# Patient Record
Sex: Male | Born: 1960
Health system: Southern US, Community
[De-identification: ages and names within clinical notes are randomized; demographics above are authoritative.]

## PROBLEM LIST (undated history)

## (undated) DIAGNOSIS — I10 Essential (primary) hypertension: Secondary | ICD-10-CM

## (undated) DIAGNOSIS — Z800 Family history of malignant neoplasm of digestive organs: Secondary | ICD-10-CM

## (undated) DIAGNOSIS — R112 Nausea with vomiting, unspecified: Secondary | ICD-10-CM

## (undated) DIAGNOSIS — Z803 Family history of malignant neoplasm of breast: Secondary | ICD-10-CM

## (undated) DIAGNOSIS — K219 Gastro-esophageal reflux disease without esophagitis: Secondary | ICD-10-CM

## (undated) DIAGNOSIS — C801 Malignant (primary) neoplasm, unspecified: Secondary | ICD-10-CM

## (undated) DIAGNOSIS — Z9889 Other specified postprocedural states: Secondary | ICD-10-CM

## (undated) DIAGNOSIS — T8859XA Other complications of anesthesia, initial encounter: Secondary | ICD-10-CM

## (undated) DIAGNOSIS — Z808 Family history of malignant neoplasm of other organs or systems: Secondary | ICD-10-CM

## (undated) HISTORY — DX: Essential (primary) hypertension: I10

## (undated) HISTORY — PX: ESOPHAGOGASTRODUODENOSCOPY (EGD) WITH PROPOFOL: SHX5813

## (undated) HISTORY — DX: Family history of malignant neoplasm of other organs or systems: Z80.8

## (undated) HISTORY — DX: Family history of malignant neoplasm of digestive organs: Z80.0

## (undated) HISTORY — DX: Family history of malignant neoplasm of breast: Z80.3

## (undated) HISTORY — PX: COLONOSCOPY WITH PROPOFOL: SHX5780

## (undated) HISTORY — DX: Gastro-esophageal reflux disease without esophagitis: K21.9

## (undated) HISTORY — DX: Malignant (primary) neoplasm, unspecified: C80.1

---

## 2000-11-11 ENCOUNTER — Emergency Department: Admit: 2000-11-11 | Payer: Self-pay | Source: Emergency Department

## 2001-09-15 ENCOUNTER — Emergency Department: Admit: 2001-09-15 | Payer: Self-pay | Source: Emergency Department | Admitting: Emergency Medicine

## 2001-09-22 ENCOUNTER — Emergency Department: Admit: 2001-09-22 | Payer: Self-pay | Source: Emergency Department

## 2002-02-11 ENCOUNTER — Emergency Department: Admit: 2002-02-11 | Payer: Self-pay | Source: Emergency Department | Admitting: Emergency Medicine

## 2004-07-11 DIAGNOSIS — C801 Malignant (primary) neoplasm, unspecified: Secondary | ICD-10-CM

## 2004-07-11 HISTORY — DX: Malignant (primary) neoplasm, unspecified: C80.1

## 2004-07-11 HISTORY — PX: TESTICLE REMOVAL: SHX68

## 2004-07-13 ENCOUNTER — Ambulatory Visit: Payer: Self-pay | Admitting: Internal Medicine

## 2004-07-16 ENCOUNTER — Ambulatory Visit: Payer: Self-pay | Admitting: Urology

## 2004-08-11 ENCOUNTER — Ambulatory Visit: Payer: Self-pay | Admitting: Internal Medicine

## 2004-09-10 ENCOUNTER — Ambulatory Visit: Payer: Self-pay | Admitting: Internal Medicine

## 2004-10-11 ENCOUNTER — Ambulatory Visit: Payer: Self-pay | Admitting: Internal Medicine

## 2004-11-23 ENCOUNTER — Ambulatory Visit: Payer: Self-pay | Admitting: Internal Medicine

## 2004-12-09 ENCOUNTER — Ambulatory Visit: Payer: Self-pay | Admitting: Internal Medicine

## 2005-03-22 ENCOUNTER — Ambulatory Visit: Payer: Self-pay | Admitting: Internal Medicine

## 2005-04-10 ENCOUNTER — Ambulatory Visit: Payer: Self-pay | Admitting: Internal Medicine

## 2005-05-26 ENCOUNTER — Ambulatory Visit: Payer: Self-pay | Admitting: Internal Medicine

## 2005-07-19 ENCOUNTER — Ambulatory Visit: Payer: Self-pay | Admitting: Internal Medicine

## 2005-08-11 ENCOUNTER — Ambulatory Visit: Payer: Self-pay | Admitting: Internal Medicine

## 2005-11-24 ENCOUNTER — Ambulatory Visit: Payer: Self-pay | Admitting: Internal Medicine

## 2005-12-01 ENCOUNTER — Ambulatory Visit: Payer: Self-pay | Admitting: Internal Medicine

## 2005-12-09 ENCOUNTER — Ambulatory Visit: Payer: Self-pay | Admitting: Internal Medicine

## 2006-05-30 ENCOUNTER — Ambulatory Visit: Payer: Self-pay | Admitting: Internal Medicine

## 2006-06-11 ENCOUNTER — Ambulatory Visit: Payer: Self-pay | Admitting: Internal Medicine

## 2006-09-21 ENCOUNTER — Ambulatory Visit: Payer: Self-pay | Admitting: Internal Medicine

## 2006-10-11 ENCOUNTER — Ambulatory Visit: Payer: Self-pay | Admitting: Internal Medicine

## 2007-03-12 ENCOUNTER — Ambulatory Visit: Payer: Self-pay | Admitting: Internal Medicine

## 2007-03-20 ENCOUNTER — Ambulatory Visit: Payer: Self-pay | Admitting: Internal Medicine

## 2007-04-11 ENCOUNTER — Ambulatory Visit: Payer: Self-pay | Admitting: Internal Medicine

## 2007-09-25 ENCOUNTER — Ambulatory Visit: Payer: Self-pay | Admitting: Internal Medicine

## 2007-09-27 ENCOUNTER — Ambulatory Visit: Payer: Self-pay | Admitting: Internal Medicine

## 2007-10-12 ENCOUNTER — Ambulatory Visit: Payer: Self-pay | Admitting: Internal Medicine

## 2008-03-27 ENCOUNTER — Ambulatory Visit: Payer: Self-pay | Admitting: Internal Medicine

## 2008-04-10 ENCOUNTER — Ambulatory Visit: Payer: Self-pay | Admitting: Internal Medicine

## 2008-04-27 ENCOUNTER — Emergency Department: Admit: 2008-04-27 | Payer: Self-pay | Source: Emergency Department | Admitting: Emergency Medicine

## 2008-04-27 LAB — URINALYSIS WITH MICROSCOPIC
Bilirubin, UA: NEGATIVE
Glucose, UA: NEGATIVE
Leukocyte Esterase, UA: NEGATIVE
Nitrite, UA: NEGATIVE
Protein, UR: 30
Specific Gravity UA POCT: 1.025 (ref <=1.030)
Urine pH: 5.5 (ref 5.0–8.0)
Urobilinogen, UA: 0.1

## 2008-09-10 ENCOUNTER — Ambulatory Visit: Payer: Self-pay | Admitting: Internal Medicine

## 2008-09-25 ENCOUNTER — Ambulatory Visit: Payer: Self-pay | Admitting: Internal Medicine

## 2008-10-11 ENCOUNTER — Ambulatory Visit: Payer: Self-pay | Admitting: Internal Medicine

## 2009-09-10 ENCOUNTER — Ambulatory Visit: Payer: Self-pay | Admitting: Internal Medicine

## 2009-09-22 ENCOUNTER — Ambulatory Visit: Payer: Self-pay | Admitting: Internal Medicine

## 2009-10-11 ENCOUNTER — Ambulatory Visit: Payer: Self-pay | Admitting: Internal Medicine

## 2011-10-29 LAB — HM COLONOSCOPY

## 2012-07-04 ENCOUNTER — Ambulatory Visit: Payer: Self-pay | Admitting: Urology

## 2014-03-12 ENCOUNTER — Emergency Department: Payer: Self-pay | Admitting: Emergency Medicine

## 2014-03-12 LAB — CBC WITH DIFFERENTIAL/PLATELET
Basophil #: 0 10*3/uL (ref 0.0–0.1)
Basophil %: 0.2 %
Eosinophil #: 0 10*3/uL (ref 0.0–0.7)
Eosinophil %: 0.4 %
HCT: 46.5 % (ref 40.0–52.0)
HGB: 15.4 g/dL (ref 13.0–18.0)
Lymphocyte #: 1.7 10*3/uL (ref 1.0–3.6)
Lymphocyte %: 19 %
MCH: 30.6 pg (ref 26.0–34.0)
MCHC: 33.1 g/dL (ref 32.0–36.0)
MCV: 92 fL (ref 80–100)
Monocyte #: 0.6 x10 3/mm (ref 0.2–1.0)
Monocyte %: 7.2 %
Neutrophil #: 6.4 10*3/uL (ref 1.4–6.5)
Neutrophil %: 73.2 %
Platelet: 248 10*3/uL (ref 150–440)
RBC: 5.04 10*6/uL (ref 4.40–5.90)
RDW: 12.9 % (ref 11.5–14.5)
WBC: 8.8 10*3/uL (ref 3.8–10.6)

## 2014-03-12 LAB — URINALYSIS, COMPLETE
Bacteria: NONE SEEN
Bilirubin,UR: NEGATIVE
Blood: NEGATIVE
Glucose,UR: NEGATIVE mg/dL (ref 0–75)
Leukocyte Esterase: NEGATIVE
Nitrite: NEGATIVE
Ph: 5 (ref 4.5–8.0)
Protein: NEGATIVE
RBC,UR: NONE SEEN /HPF (ref 0–5)
Specific Gravity: 1.027 (ref 1.003–1.030)
Squamous Epithelial: NONE SEEN
WBC UR: <1 /HPF (ref 0–5)

## 2014-03-12 LAB — COMPREHENSIVE METABOLIC PANEL
Albumin: 4.4 g/dL (ref 3.4–5.0)
Alkaline Phosphatase: 68 U/L
Anion Gap: 7 (ref 7–16)
BUN: 14 mg/dL (ref 7–18)
Bilirubin,Total: 0.7 mg/dL (ref 0.2–1.0)
Calcium, Total: 9.5 mg/dL (ref 8.5–10.1)
Chloride: 106 mmol/L (ref 98–107)
Co2: 27 mmol/L (ref 21–32)
Creatinine: 1.09 mg/dL (ref 0.60–1.30)
EGFR (African American): >60
EGFR (Non-African Amer.): >60
Glucose: 99 mg/dL (ref 65–99)
Osmolality: 280 (ref 275–301)
Potassium: 3.8 mmol/L (ref 3.5–5.1)
SGOT(AST): 21 U/L (ref 15–37)
SGPT (ALT): 34 U/L (ref 12–78)
Sodium: 140 mmol/L (ref 136–145)
Total Protein: 8.1 g/dL (ref 6.4–8.2)

## 2014-03-12 LAB — TROPONIN I: Troponin-I: <0.02 ng/mL

## 2014-03-12 LAB — LIPASE, BLOOD: Lipase: 81 U/L (ref 73–393)

## 2015-04-09 ENCOUNTER — Other Ambulatory Visit: Payer: Self-pay | Admitting: Unknown Physician Specialty

## 2015-04-09 DIAGNOSIS — M25561 Pain in right knee: Secondary | ICD-10-CM

## 2015-04-11 ENCOUNTER — Ambulatory Visit
Admission: RE | Admit: 2015-04-11 | Discharge: 2015-04-11 | Disposition: A | Discharge status: Home / Self Care | Payer: BLUE CROSS/BLUE SHIELD | Source: Ambulatory Visit | Attending: Unknown Physician Specialty | Admitting: Unknown Physician Specialty

## 2015-04-11 DIAGNOSIS — M25461 Effusion, right knee: Secondary | ICD-10-CM | POA: Insufficient documentation

## 2015-04-11 DIAGNOSIS — M25561 Pain in right knee: Secondary | ICD-10-CM

## 2015-04-18 ENCOUNTER — Ambulatory Visit: Payer: Self-pay

## 2015-05-23 ENCOUNTER — Ambulatory Visit (INDEPENDENT_AMBULATORY_CARE_PROVIDER_SITE_OTHER): Payer: BLUE CROSS/BLUE SHIELD | Admitting: Unknown Physician Specialty

## 2015-05-23 ENCOUNTER — Encounter: Payer: Self-pay | Admitting: Unknown Physician Specialty

## 2015-05-23 VITALS — BP 156/90 | HR 73 | Temp 98.6°F | Ht 74.1 in | Wt 283.6 lb

## 2015-05-23 DIAGNOSIS — J069 Acute upper respiratory infection, unspecified: Secondary | ICD-10-CM | POA: Diagnosis not present

## 2015-05-23 DIAGNOSIS — I10 Essential (primary) hypertension: Secondary | ICD-10-CM | POA: Diagnosis not present

## 2015-05-23 DIAGNOSIS — K219 Gastro-esophageal reflux disease without esophagitis: Secondary | ICD-10-CM

## 2015-05-23 DIAGNOSIS — C6290 Malignant neoplasm of unspecified testis, unspecified whether descended or undescended: Secondary | ICD-10-CM | POA: Insufficient documentation

## 2015-05-23 NOTE — Patient Instructions (Signed)
Think you're too busy to work out? We have the workout for you. In minutes, high-intensity interval training (H.I.I.T.) will have you sweating, breathing hard and maximizing the health benefits of exercise without the time commitment. Best of all, it's scientifically proven to work.  What Is H.I.I.T.? SHORT WORKOUTS 101 High-intensity interval training - referred to as H.I.I.T. - is based on the idea that short bursts of strenuous exercise can have a big impact on the body. If moderate exercise - like a 20-minute jog - is good for your heart, lungs and metabolism, H.I.I.T. packs the benefits of that workout and more into a few minutes. It may sound too good to be true, but learning this exercise technique and adapting it to your life can mean saving hours at the gym. If you think you don't have time to exercise, H.I.I.T. may be the workout for you.  You can try it with any aerobic activity you like. The principles of H.I.I.T. can be applied to running, biking, stair climbing, swimming, jumping rope, rowing, even hopping or skipping. (Yes, skipping!)  The downside? Even though H.I.I.T. lasts only minutes, the workouts are tough, requiring you to push your body near its limit.  HOW INTENSE IS HIGH INTENSITY? High-intensity exercise is obviously not a casual stroll down the street, but it's not a run-till-your-lungs-pop explosion, either. Think breathless, not winded. Heart-pounding, not exploding. Legs pumping, but not uncontrolled.  You don't need any fancy heart rate monitors to do these workouts. Use cues from your body as a guide. In the middle of a high-intensity workout you should be able to say single words, but not complete whole sentences. So, if you can keep chatting to your workout partner during this workout, pump it up a few notches.  07-30-29 Training This simple program will help you make the most of a short workout by improving heart health and endurance. Try it with your favorite  cardiovascular activity. The essentials of 07-30-29 training are simple. Run, ride or perhaps row on a rowing machine gently for 30 seconds, accelerate to a moderate pace for 20 seconds, then sprint as hard as you can for 10 seconds. (It should be called 30-20-10 training, obviously, but that is not as catchy.) Repeat.  You don't even need a stopwatch to monitor the 30-, 20-, and 10-second time changes. You can just count to yourself, which seems to make the intervals pass more quickly.  Best of all? The grueling, all-out portion of the workout lasts for only 10 seconds. C'mon, you can do anything for 10 seconds, right?  Got 10 Minutes? A solitary minute of hard work buried in 10 minutes of activity can make a big difference.  The 10-Minute Workout If you like to run, bike, row or swim - just a little bit - this workout is a great option for you. Step 1 Warm up for 2 minutes Step 2 Pedal, run or swim all-out for 20 seconds. Repeat 2 more times Warm down for 3 minutes    GET STARTED To benefit the most from really, really short workouts, you need to build the habit of doing them into your hectic life. Ideally, you'll complete the workout three times a week. The best way to build that habit is to start small and be willing to tweak your schedule where you can to accommodate your new workout.  First set up a spot in your house for your workout, equipped with whatever you need to get the job done: sneakers, a  chair, a towel, etc. Then slot your workout in before you would normally shower. (You can even do it in the bathroom.) Or wake up five minutes earlier and do it first thing in the morning, so you can head off to work feeling accomplished. Or do it during your lunch hour. Run up your office's stairs or grab a private conference room for just a few minutes. Or work it into your commute. If you walk or bike to work, add some heavy intervals on the way home.  GET A BOOST FROM MUSIC Creating a  workout playlist of high-energy tunes you love will not make your workout feel easier, but it may cause you to exercise harder without even realizing it. Best of all, if you are doing a really short workout, you need only one or two great tunes to get you through. If you are willing to try something a bit different, make your own music as you exercise. Sing, hum, clap your hands, whatever you can do to jam along to your playlist. It may give you an extra boost to finish strong.  Find a song or podcast that's the length of your really, really short workout. By the time the song is over, you're done.  Excerpted from the [employer] Well column http://www.nytimes.com/well/guides/really-really-short-workouts?smid=fb-nytwell&smtyp=pay  

## 2015-05-23 NOTE — Assessment & Plan Note (Signed)
In the setting of cold medication and Meloxicam.  Will stop those and check with home monitor.

## 2015-05-23 NOTE — Assessment & Plan Note (Signed)
Stop Meloxicam and wean off Pantoprazole slowly

## 2015-05-23 NOTE — Progress Notes (Signed)
BP 156/90 mmHg  Pulse 73  Temp(Src) 98.6 F (37 C)  Ht 6' 2.1" (1.882 m)  Wt 283 lb 9.6 oz (128.64 kg)  BMI 36.32 kg/m2  SpO2 95%   Subjective:    Patient ID: Micheal Khan, male    DOB: September 05, 1961, 54 y.o.   MRN: [id number]  HPI: Micheal Khan is a 54 y.o. male  Chief Complaint  Patient presents with  . Hypertension    pt states his blood pressure was up and is concerned  . Sinusitis    pt states he has had runny nose, sinus headache, sinus pressure and sinus drainage  . GI Problem    pt has also had some stomach issues, and loose stools   Hypertension This is a new problem. The current episode started yesterday (Noted after taking OTC cold medication). Associated symptoms include headaches. Pertinent negatives include no blurred vision, chest pain, palpitations, shortness of breath or sweats. Associated agents: OTC cold medication.  GI Problem The primary symptoms include abdominal pain and diarrhea. Primary symptoms comment: had been on Pantoprazole.  But taking Mobic through Dr. Jefm Bryant.  . The illness began 3 to 5 days ago.  Significant associated medical issues include GERD.  URI  This is a new problem. The current episode started in the past 7 days. The problem has been gradually improving. There has been no fever. Associated symptoms include abdominal pain, congestion, coughing, diarrhea, headaches, sinus pain and a sore throat. Pertinent negatives include no chest pain. He has tried decongestant for the symptoms. The treatment provided mild relief.     Relevant past medical, surgical, family and social history reviewed and updated as indicated. Interim medical history since our last visit reviewed. Allergies and medications reviewed and updated.  Review of Systems  HENT: Positive for congestion and sore throat.   Eyes: Negative for blurred vision.  Respiratory: Positive for cough. Negative for shortness of breath.   Cardiovascular: Negative for chest pain and  palpitations.  Gastrointestinal: Positive for abdominal pain and diarrhea.  Neurological: Positive for headaches.    Per HPI unless specifically indicated above     Objective:    BP 156/90 mmHg  Pulse 73  Temp(Src) 98.6 F (37 C)  Ht 6' 2.1" (1.882 m)  Wt 283 lb 9.6 oz (128.64 kg)  BMI 36.32 kg/m2  SpO2 95%  Wt Readings from Last 3 Encounters:  05/23/15 283 lb 9.6 oz (128.64 kg)  03/29/14 280 lb (127.007 kg)  04/11/15 292 lb (132.45 kg)    Physical Exam  Constitutional: He is oriented to person, place, and time. He appears well-developed and well-nourished. No distress.  HENT:  Head: Normocephalic and atraumatic.  Eyes: Conjunctivae and lids are normal. Right eye exhibits no discharge. Left eye exhibits no discharge. No scleral icterus.  Cardiovascular: Normal rate, regular rhythm and normal heart sounds.   Pulmonary/Chest: Effort normal and breath sounds normal. No respiratory distress.  Abdominal: Normal appearance and bowel sounds are normal. He exhibits no distension. There is no splenomegaly or hepatomegaly. There is no tenderness.  Musculoskeletal: Normal range of motion.  Neurological: He is alert and oriented to person, place, and time.  Skin: Skin is warm, dry and intact. No rash noted. No pallor.  Psychiatric: He has a normal mood and affect. His behavior is normal. Judgment and thought content normal.     Assessment & Plan:   Problem List Items Addressed This Visit      Unprioritized   GERD (gastroesophageal  reflux disease)    Stop Meloxicam and wean off Pantoprazole slowly      Essential hypertension, benign - Primary    In the setting of cold medication and Meloxicam.  Will stop those and check with home monitor.         Other Visit Diagnoses    URI (upper respiratory infection)        Supportive care        Follow up plan: Return in about 4 weeks (around 06/20/2015), or if symptoms worsen or fail to improve, for BP.

## 2015-06-20 ENCOUNTER — Ambulatory Visit (INDEPENDENT_AMBULATORY_CARE_PROVIDER_SITE_OTHER): Payer: BLUE CROSS/BLUE SHIELD | Admitting: Unknown Physician Specialty

## 2015-06-20 ENCOUNTER — Encounter: Payer: Self-pay | Admitting: Unknown Physician Specialty

## 2015-06-20 VITALS — BP 125/77 | HR 68 | Temp 99.3°F | Ht 74.2 in | Wt 276.4 lb

## 2015-06-20 DIAGNOSIS — K219 Gastro-esophageal reflux disease without esophagitis: Secondary | ICD-10-CM | POA: Diagnosis not present

## 2015-06-20 DIAGNOSIS — I10 Essential (primary) hypertension: Secondary | ICD-10-CM | POA: Diagnosis not present

## 2015-06-20 MED ORDER — MOMETASONE FUROATE 50 MCG/ACT NA SUSP: 2.0000 | Freq: Every day | NASAL | Status: DC

## 2015-06-20 NOTE — Assessment & Plan Note (Signed)
Well controlled blood pressures at home for the last month. Will continue to monitor.

## 2015-06-20 NOTE — Assessment & Plan Note (Addendum)
Discuss taper off of Pantoprazole.  Will begin to ween off and slowly replace with zantac OTC

## 2015-06-20 NOTE — Progress Notes (Signed)
BP 125/77 mmHg  Pulse 68  Temp(Src) 99.3 F (37.4 C)  Ht 6' 2.2" (1.885 m)  Wt 276 lb 6.4 oz (125.374 kg)  BMI 35.28 kg/m2  SpO2 97%   Subjective:    Patient ID: Micheal Khan, male    DOB: 04-25-1961, 54 y.o.   MRN: [id number]  HPI: Micheal Khan is a 54 y.o. male  Chief Complaint  Patient presents with  . Hypertension    pt states he has been check BP at home, has list with recordings for provider to see.   Hypertension: His home blood pressure readings have been stable - 235'T- 732 systolic and 20'U diastolic. He denies and chest pain, headaches or shortness of breath. He has begun walking each day for exercise. Excellent compliance.  GERD: Currently taking pantoprazole but wishes to wean off due to history of stress fractures . He denies any reflux or stomach problems. Has bought zantac OTC to slowly replace the pantoprazole.  Relevant past medical, surgical, family and social history reviewed and updated as indicated. Interim medical history since our last visit reviewed. Allergies and medications reviewed and updated.  Review of Systems  Constitutional: Negative.  Negative for fever, activity change, appetite change and fatigue.  HENT: Negative.  Negative for congestion, postnasal drip, rhinorrhea, sinus pressure, sneezing and sore throat.   Respiratory: Negative.  Negative for cough, choking, chest tightness, shortness of breath, wheezing and stridor.   Cardiovascular: Negative.  Negative for chest pain, palpitations and leg swelling.  Musculoskeletal: Negative.  Negative for back pain, joint swelling, arthralgias and gait problem.  Skin: Negative.  Negative for color change, pallor, rash and wound.  Neurological: Negative.   Psychiatric/Behavioral: Negative.  Negative for confusion, sleep disturbance, self-injury and agitation. The patient is not nervous/anxious and is not hyperactive.     Per HPI unless specifically indicated above     Objective:    BP 125/77  mmHg  Pulse 68  Temp(Src) 99.3 F (37.4 C)  Ht 6' 2.2" (1.885 m)  Wt 276 lb 6.4 oz (125.374 kg)  BMI 35.28 kg/m2  SpO2 97%  Wt Readings from Last 3 Encounters:  06/20/15 276 lb 6.4 oz (125.374 kg)  05/23/15 283 lb 9.6 oz (128.64 kg)  03/29/14 280 lb (127.007 kg)    Physical Exam  Constitutional: He is oriented to person, place, and time. He appears well-developed and well-nourished. No distress.  HENT:  Head: Normocephalic and atraumatic.  Mouth/Throat: No oropharyngeal exudate.  Neck: Normal range of motion.  Cardiovascular: Normal rate, regular rhythm and normal heart sounds.  Exam reveals no gallop and no friction rub.   No murmur heard. Pulmonary/Chest: Effort normal.  Neurological: He is alert and oriented to person, place, and time.  Skin: Skin is warm and dry. No rash noted. He is not diaphoretic. No erythema. No pallor.  Psychiatric: He has a normal mood and affect. His behavior is normal. Judgment and thought content normal.        Assessment & Plan:   Problem List Items Addressed This Visit      Unprioritized   GERD (gastroesophageal reflux disease) - Primary    Discuss taper off of Pantoprazole.  Will begin to ween off and slowly replace with zantac OTC      Relevant Medications   pantoprazole (PROTONIX) 40 MG tablet   Essential hypertension, benign    Well controlled blood pressures at home for the last month. Will continue to monitor.  Follow up plan: Return in about 6 months (around 12/18/2015) for physical.

## 2015-12-26 ENCOUNTER — Encounter: Payer: BLUE CROSS/BLUE SHIELD | Admitting: Unknown Physician Specialty

## 2016-01-16 ENCOUNTER — Encounter: Payer: BLUE CROSS/BLUE SHIELD | Admitting: Unknown Physician Specialty

## 2016-02-25 ENCOUNTER — Encounter: Payer: Self-pay | Admitting: Unknown Physician Specialty

## 2016-02-25 ENCOUNTER — Ambulatory Visit (INDEPENDENT_AMBULATORY_CARE_PROVIDER_SITE_OTHER): Payer: BLUE CROSS/BLUE SHIELD | Admitting: Unknown Physician Specialty

## 2016-02-25 VITALS — BP 145/93 | HR 64 | Temp 98.3°F | Ht 73.5 in | Wt 279.7 lb

## 2016-02-25 DIAGNOSIS — Z8547 Personal history of malignant neoplasm of testis: Secondary | ICD-10-CM

## 2016-02-25 DIAGNOSIS — Z0000 Encounter for general adult medical examination without abnormal findings: Secondary | ICD-10-CM

## 2016-02-25 DIAGNOSIS — K219 Gastro-esophageal reflux disease without esophagitis: Secondary | ICD-10-CM | POA: Diagnosis not present

## 2016-02-25 DIAGNOSIS — I10 Essential (primary) hypertension: Secondary | ICD-10-CM

## 2016-02-25 NOTE — Assessment & Plan Note (Addendum)
Borderline high today, better at home.  Continue home monitoring

## 2016-02-25 NOTE — Progress Notes (Signed)
BP 145/93 mmHg  Pulse 64  Temp(Src) 98.3 F (36.8 C)  Ht 6' 1.5" (1.867 m)  Wt 279 lb 11.2 oz (126.871 kg)  BMI 36.40 kg/m2  SpO2 96%   Subjective:    Patient ID: Micheal Khan, male    DOB: April 10, 1961, 55 y.o.   MRN: [id number]  HPI: Micheal Khan is a 55 y.o. male  Chief Complaint  Patient presents with  . Annual Exam    Hep C and HIV order entered   Hypertension Using medications without difficulty Average home BPs Hasn't checked much in the last month but 120's/70's when he does.     No problems or lightheadedness No chest pain with exertion or shortness of breath No Edema  Reflux Managing with Zantac every morning.    Family History  Problem Relation Age of Onset  . Cancer Mother     melanoma  . Heart disease Father     MI  . Cancer Maternal Grandfather   . Stroke Paternal Grandmother   . Heart disease Paternal Grandfather     Relevant past medical, surgical, family and social history reviewed and updated as indicated. Interim medical history since our last visit reviewed. Allergies and medications reviewed and updated.  Review of Systems  Constitutional: Negative.   HENT: Negative.   Eyes: Negative.   Respiratory: Negative.   Cardiovascular: Negative.   Gastrointestinal: Negative.   Endocrine: Negative.   Genitourinary: Negative.   Musculoskeletal:       Knees are doing better  Skin: Negative.   Allergic/Immunologic: Negative.   Neurological: Negative.   Hematological: Negative.   Psychiatric/Behavioral: Negative.     Per HPI unless specifically indicated above     Objective:    BP 145/93 mmHg  Pulse 64  Temp(Src) 98.3 F (36.8 C)  Ht 6' 1.5" (1.867 m)  Wt 279 lb 11.2 oz (126.871 kg)  BMI 36.40 kg/m2  SpO2 96%  Wt Readings from Last 3 Encounters:  02/25/16 279 lb 11.2 oz (126.871 kg)  06/20/15 276 lb 6.4 oz (125.374 kg)  05/23/15 283 lb 9.6 oz (128.64 kg)    Physical Exam  Constitutional: He is oriented to person, place, and  time. He appears well-developed and well-nourished.  HENT:  Head: Normocephalic.  Right Ear: Tympanic membrane, external ear and ear canal normal.  Left Ear: Tympanic membrane, external ear and ear canal normal.  Mouth/Throat: Uvula is midline, oropharynx is clear and moist and mucous membranes are normal.  Eyes: Pupils are equal, round, and reactive to light.  Cardiovascular: Normal rate, regular rhythm and normal heart sounds.  Exam reveals no gallop and no friction rub.   No murmur heard. Pulmonary/Chest: Effort normal and breath sounds normal. No respiratory distress.  Abdominal: Soft. Bowel sounds are normal. He exhibits no distension. There is no tenderness.  Genitourinary: Rectum normal, prostate normal and penis normal.  Musculoskeletal: Normal range of motion.  Neurological: He is alert and oriented to person, place, and time. He has normal reflexes.  Skin: Skin is warm and dry.  Psychiatric: He has a normal mood and affect. His behavior is normal. Judgment and thought content normal.  Vitals reviewed.   Results for orders placed or performed in visit on 05/23/15  HM COLONOSCOPY  Result Value Ref Range   HM Colonoscopy from [facility]       Assessment & Plan:   Problem List Items Addressed This Visit      Unprioritized   Essential hypertension, benign  Borderline high today, better at home.  Continue home monitoring       Relevant Orders   Comprehensive metabolic panel   GERD (gastroesophageal reflux disease)    Continue Zantac      Relevant Medications   ranitidine (ZANTAC) 150 MG tablet    Other Visit Diagnoses    Health care maintenance    -  Primary    Relevant Orders    Hepatitis C antibody    HIV antibody    CBC with Differential/Platelet    Comprehensive metabolic panel    Lipid Panel w/o Chol/HDL Ratio    TSH    H/O testicular cancer        Relevant Orders    AFP tumor marker    LDH Isoenzyme    B-HCG Quant        Follow up plan: Return in  about 1 year (around 02/24/2017).

## 2016-02-25 NOTE — Addendum Note (Signed)
Addended by: Kathrine Haddock on: 02/25/2016 02:55 PM   Modules accepted: Orders, SmartSet

## 2016-02-25 NOTE — Assessment & Plan Note (Signed)
Continue Zantac 

## 2016-02-26 NOTE — Progress Notes (Signed)
Quick Note:  Normal labs. Pt notified through mychart ______ 

## 2016-02-27 LAB — HCG, BETA SUBUNIT, QN (SERIAL): HCG, Beta Chain, Quant, S: <1 m[IU]/mL (ref 0–3)

## 2016-02-27 LAB — COMPREHENSIVE METABOLIC PANEL
ALT: 23 IU/L (ref 0–44)
AST: 21 IU/L (ref 0–40)
Albumin/Globulin Ratio: 2.4 — ABNORMAL HIGH (ref 1.2–2.2)
Albumin: 4.7 g/dL (ref 3.5–5.5)
Alkaline Phosphatase: 65 IU/L (ref 39–117)
BUN/Creatinine Ratio: 11 (ref 9–20)
BUN: 13 mg/dL (ref 6–24)
Bilirubin Total: 0.3 mg/dL (ref 0.0–1.2)
CO2: 23 mmol/L (ref 18–29)
Calcium: 9.7 mg/dL (ref 8.7–10.2)
Chloride: 101 mmol/L (ref 96–106)
Creatinine, Ser: 1.18 mg/dL (ref 0.76–1.27)
GFR calc Af Amer: 81 mL/min/{1.73_m2} (ref >59)
GFR calc non Af Amer: 70 mL/min/{1.73_m2} (ref >59)
Globulin, Total: 2 g/dL (ref 1.5–4.5)
Glucose: 94 mg/dL (ref 65–99)
Potassium: 4.7 mmol/L (ref 3.5–5.2)
Sodium: 148 mmol/L — ABNORMAL HIGH (ref 134–144)
Total Protein: 6.7 g/dL (ref 6.0–8.5)

## 2016-02-27 LAB — CBC WITH DIFFERENTIAL/PLATELET
Basophils Absolute: 0 10*3/uL (ref 0.0–0.2)
Basos: 0 %
EOS (ABSOLUTE): 0.1 10*3/uL (ref 0.0–0.4)
Eos: 2 %
Hematocrit: 43.6 % (ref 37.5–51.0)
Hemoglobin: 14.4 g/dL (ref 12.6–17.7)
Immature Grans (Abs): 0 10*3/uL (ref 0.0–0.1)
Immature Granulocytes: 0 %
Lymphocytes Absolute: 1.8 10*3/uL (ref 0.7–3.1)
Lymphs: 28 %
MCH: 30.6 pg (ref 26.6–33.0)
MCHC: 33 g/dL (ref 31.5–35.7)
MCV: 93 fL (ref 79–97)
Monocytes Absolute: 0.6 10*3/uL (ref 0.1–0.9)
Monocytes: 9 %
Neutrophils Absolute: 4 10*3/uL (ref 1.4–7.0)
Neutrophils: 61 %
Platelets: 233 10*3/uL (ref 150–379)
RBC: 4.71 x10E6/uL (ref 4.14–5.80)
RDW: 13.7 % (ref 12.3–15.4)
WBC: 6.6 10*3/uL (ref 3.4–10.8)

## 2016-02-27 LAB — LACTATE DEHYDROGENASE, ISOENZYMES
(LD) Fraction 1: 26 % (ref 17–32)
(LD) Fraction 2: 40 % (ref 25–40)
(LD) Fraction 3: 23 % (ref 17–27)
(LD) Fraction 4: 4 % — ABNORMAL LOW (ref 5–13)
(LD) Fraction 5: 7 % (ref 4–20)
LDH: 144 IU/L (ref 121–224)

## 2016-02-27 LAB — LIPID PANEL W/O CHOL/HDL RATIO
Cholesterol, Total: 199 mg/dL (ref 100–199)
HDL: 47 mg/dL (ref >39)
LDL Calculated: 115 mg/dL — ABNORMAL HIGH (ref 0–99)
Triglycerides: 186 mg/dL — ABNORMAL HIGH (ref 0–149)
VLDL Cholesterol Cal: 37 mg/dL (ref 5–40)

## 2016-02-27 LAB — AFP TUMOR MARKER: AFP-Tumor Marker: 3 ng/mL (ref 0.0–8.3)

## 2016-02-27 LAB — HIV ANTIBODY (ROUTINE TESTING W REFLEX): HIV Screen 4th Generation wRfx: NONREACTIVE

## 2016-02-27 LAB — TSH: TSH: 3.29 u[IU]/mL (ref 0.450–4.500)

## 2016-02-27 LAB — HEPATITIS C ANTIBODY: Hep C Virus Ab: <0.1 s/co ratio (ref 0.0–0.9)

## 2016-06-24 DIAGNOSIS — H5213 Myopia, bilateral: Secondary | ICD-10-CM | POA: Diagnosis not present

## 2016-07-07 DIAGNOSIS — H0013 Chalazion right eye, unspecified eyelid: Secondary | ICD-10-CM | POA: Diagnosis not present

## 2016-08-24 ENCOUNTER — Ambulatory Visit (INDEPENDENT_AMBULATORY_CARE_PROVIDER_SITE_OTHER): Payer: BLUE CROSS/BLUE SHIELD

## 2016-08-24 DIAGNOSIS — Z23 Encounter for immunization: Secondary | ICD-10-CM

## 2017-02-25 ENCOUNTER — Encounter: Payer: BLUE CROSS/BLUE SHIELD | Admitting: Unknown Physician Specialty

## 2017-03-14 ENCOUNTER — Encounter: Payer: Self-pay | Admitting: Unknown Physician Specialty

## 2017-03-14 ENCOUNTER — Ambulatory Visit (INDEPENDENT_AMBULATORY_CARE_PROVIDER_SITE_OTHER): Payer: BLUE CROSS/BLUE SHIELD | Admitting: Unknown Physician Specialty

## 2017-03-14 VITALS — BP 134/81 | HR 68 | Temp 98.4°F | Ht 73.1 in | Wt 277.2 lb

## 2017-03-14 DIAGNOSIS — Z0000 Encounter for general adult medical examination without abnormal findings: Secondary | ICD-10-CM | POA: Diagnosis not present

## 2017-03-14 DIAGNOSIS — L989 Disorder of the skin and subcutaneous tissue, unspecified: Secondary | ICD-10-CM | POA: Diagnosis not present

## 2017-03-14 DIAGNOSIS — Z8547 Personal history of malignant neoplasm of testis: Secondary | ICD-10-CM | POA: Diagnosis not present

## 2017-03-14 NOTE — Addendum Note (Signed)
Addended by: Kathrine Haddock on: 03/14/2017 02:41 PM   Modules accepted: Orders

## 2017-03-14 NOTE — Progress Notes (Addendum)
+++++++++--+++++++++++++++++++++++++++++++++   BP 134/81   Pulse 68   Temp 98.4 F (36.9 C)   Ht 6' 1.1" (1.857 m)   Wt 277 lb 3.2 oz (125.7 kg)   SpO2 95%   BMI 36.47 kg/m    Subjective:    Patient ID: Micheal Khan, male    DOB: 07/26/1961, 56 y.o.   MRN: [id number]  HPI: Micheal Khan is a 56 y.o. male  Chief Complaint  Patient presents with  . Annual Exam   Social History   Social History  . Marital status: Married    Spouse name: N/A  . Number of children: N/A  . Years of education: N/A   Occupational History  . Not on file.   Social History Main Topics  . Smoking status: Never Smoker  . Smokeless tobacco: Never Used  . Alcohol use No  . Drug use: No  . Sexual activity: Yes   Other Topics Concern  . Not on file   Social History Narrative  . No narrative on file   Family History  Problem Relation Age of Onset  . Cancer Mother        melanoma  . Heart disease Father        MI  . Cancer Maternal Grandfather   . Stroke Paternal Grandmother   . Heart disease Paternal Grandfather    Past Medical History:  Diagnosis Date  . Cancer ([place])    testicular  . GERD (gastroesophageal reflux disease)    History reviewed. No pertinent surgical history.  Pt states he walks about 3 miles/day.   Relevant past medical, surgical, family and social history reviewed and updated as indicated. Interim medical history since our last visit reviewed. Allergies and medications reviewed and updated.  Review of Systems  Constitutional: Negative.   HENT: Negative.   Eyes: Negative.   Respiratory: Negative.   Cardiovascular: Negative.   Gastrointestinal: Negative.   Endocrine: Negative.   Genitourinary: Negative.        Ranitidine works for reflux  Skin:       Wants a dermatology appt to manage multiple skin lesions  Allergic/Immunologic: Negative.   Neurological: Negative.   Hematological: Negative.   Psychiatric/Behavioral: Negative.    Per HPI unless  specifically indicated above     Objective:    BP 134/81   Pulse 68   Temp 98.4 F (36.9 C)   Ht 6' 1.1" (1.857 m)   Wt 277 lb 3.2 oz (125.7 kg)   SpO2 95%   BMI 36.47 kg/m   Wt Readings from Last 3 Encounters:  03/14/17 277 lb 3.2 oz (125.7 kg)  02/25/16 279 lb 11.2 oz (126.9 kg)  06/20/15 276 lb 6.4 oz (125.4 kg)    Physical Exam  Constitutional: He is oriented to person, place, and time. He appears well-developed and well-nourished.  HENT:  Head: Normocephalic.  Right Ear: Tympanic membrane, external ear and ear canal normal.  Left Ear: Tympanic membrane, external ear and ear canal normal.  Mouth/Throat: Uvula is midline, oropharynx is clear and moist and mucous membranes are normal.  Eyes: Pupils are equal, round, and reactive to light.  Cardiovascular: Normal rate, regular rhythm and normal heart sounds.  Exam reveals no gallop and no friction rub.   No murmur heard. Pulmonary/Chest: Effort normal and breath sounds normal. No respiratory distress.  Abdominal: Soft. Bowel sounds are normal. He exhibits no distension. There is no tenderness.  Genitourinary: Rectum normal and prostate normal.  Musculoskeletal: Normal range  of motion.  Neurological: He is alert and oriented to person, place, and time. He has normal reflexes.  Skin: Skin is warm and dry.  Psychiatric: He has a normal mood and affect. His behavior is normal. Judgment and thought content normal.       Assessment & Plan:   Problem List Items Addressed This Visit    None    Visit Diagnoses    Routine general medical examination at a health care facility    -  Primary   Relevant Orders   CBC with Differential/Platelet   Comprehensive metabolic panel   Lipid Panel w/o Chol/HDL Ratio   PSA   TSH   History of testicular cancer       Relevant Orders   AFP tumor marker   Lactate Dehydrogenase (LDH)   B-HCG Quant   Skin lesions       Relevant Orders   Ambulatory referral to Dermatology       Follow  up plan: Return in about 1 year (around 03/14/2018).

## 2017-03-14 NOTE — Patient Instructions (Addendum)

## 2017-03-15 ENCOUNTER — Encounter: Payer: Self-pay | Admitting: Unknown Physician Specialty

## 2017-03-15 LAB — LIPID PANEL W/O CHOL/HDL RATIO
Cholesterol, Total: 216 mg/dL — ABNORMAL HIGH (ref 100–199)
HDL: 43 mg/dL (ref >39)
LDL Calculated: 117 mg/dL — ABNORMAL HIGH (ref 0–99)
Triglycerides: 281 mg/dL — ABNORMAL HIGH (ref 0–149)
VLDL Cholesterol Cal: 56 mg/dL — ABNORMAL HIGH (ref 5–40)

## 2017-03-15 LAB — COMPREHENSIVE METABOLIC PANEL
ALT: 21 IU/L (ref 0–44)
AST: 29 IU/L (ref 0–40)
Albumin/Globulin Ratio: 2.2 (ref 1.2–2.2)
Albumin: 4.9 g/dL (ref 3.5–5.5)
Alkaline Phosphatase: 61 IU/L (ref 39–117)
BUN/Creatinine Ratio: 18 (ref 9–20)
BUN: 21 mg/dL (ref 6–24)
Bilirubin Total: 0.3 mg/dL (ref 0.0–1.2)
CO2: 22 mmol/L (ref 18–29)
Calcium: 9.4 mg/dL (ref 8.7–10.2)
Chloride: 104 mmol/L (ref 96–106)
Creatinine, Ser: 1.16 mg/dL (ref 0.76–1.27)
GFR calc Af Amer: 82 mL/min/{1.73_m2} (ref >59)
GFR calc non Af Amer: 71 mL/min/{1.73_m2} (ref >59)
Globulin, Total: 2.2 g/dL (ref 1.5–4.5)
Glucose: 88 mg/dL (ref 65–99)
Potassium: 4.4 mmol/L (ref 3.5–5.2)
Sodium: 141 mmol/L (ref 134–144)
Total Protein: 7.1 g/dL (ref 6.0–8.5)

## 2017-03-15 LAB — CBC WITH DIFFERENTIAL/PLATELET
Basophils Absolute: 0 10*3/uL (ref 0.0–0.2)
Basos: 0 %
EOS (ABSOLUTE): 0.2 10*3/uL (ref 0.0–0.4)
Eos: 2 %
Hematocrit: 43.1 % (ref 37.5–51.0)
Hemoglobin: 14.4 g/dL (ref 13.0–17.7)
Immature Grans (Abs): 0 10*3/uL (ref 0.0–0.1)
Immature Granulocytes: 0 %
Lymphocytes Absolute: 2.1 10*3/uL (ref 0.7–3.1)
Lymphs: 26 %
MCH: 30.4 pg (ref 26.6–33.0)
MCHC: 33.4 g/dL (ref 31.5–35.7)
MCV: 91 fL (ref 79–97)
Monocytes Absolute: 0.8 10*3/uL (ref 0.1–0.9)
Monocytes: 10 %
Neutrophils Absolute: 4.9 10*3/uL (ref 1.4–7.0)
Neutrophils: 62 %
Platelets: 237 10*3/uL (ref 150–379)
RBC: 4.73 x10E6/uL (ref 4.14–5.80)
RDW: 13.5 % (ref 12.3–15.4)
WBC: 8 10*3/uL (ref 3.4–10.8)

## 2017-03-15 LAB — LACTATE DEHYDROGENASE: LDH: 179 IU/L (ref 121–224)

## 2017-03-15 LAB — TSH: TSH: 3.35 u[IU]/mL (ref 0.450–4.500)

## 2017-03-15 LAB — AFP TUMOR MARKER: AFP-Tumor Marker: 2.8 ng/mL (ref 0.0–8.3)

## 2017-03-15 LAB — PSA: Prostate Specific Ag, Serum: 0.6 ng/mL (ref 0.0–4.0)

## 2017-03-15 NOTE — Progress Notes (Signed)
Normal labs.  Pt notified through mychart

## 2017-03-16 DIAGNOSIS — L738 Other specified follicular disorders: Secondary | ICD-10-CM | POA: Diagnosis not present

## 2017-03-16 DIAGNOSIS — L821 Other seborrheic keratosis: Secondary | ICD-10-CM | POA: Diagnosis not present

## 2017-03-16 DIAGNOSIS — D2330 Other benign neoplasm of skin of unspecified part of face: Secondary | ICD-10-CM | POA: Diagnosis not present

## 2017-03-16 DIAGNOSIS — L918 Other hypertrophic disorders of the skin: Secondary | ICD-10-CM | POA: Diagnosis not present

## 2017-05-13 ENCOUNTER — Ambulatory Visit (INDEPENDENT_AMBULATORY_CARE_PROVIDER_SITE_OTHER): Payer: BLUE CROSS/BLUE SHIELD | Admitting: Unknown Physician Specialty

## 2017-05-13 ENCOUNTER — Encounter: Payer: Self-pay | Admitting: Unknown Physician Specialty

## 2017-05-13 VITALS — BP 125/75 | HR 64 | Temp 98.8°F | Wt 277.4 lb

## 2017-05-13 DIAGNOSIS — R197 Diarrhea, unspecified: Secondary | ICD-10-CM | POA: Diagnosis not present

## 2017-05-13 DIAGNOSIS — R1012 Left upper quadrant pain: Secondary | ICD-10-CM

## 2017-05-13 DIAGNOSIS — R109 Unspecified abdominal pain: Secondary | ICD-10-CM | POA: Insufficient documentation

## 2017-05-13 MED ORDER — OMEPRAZOLE 20 MG PO CPDR: 20.0000 mg | DELAYED_RELEASE_CAPSULE | Freq: Every day | ORAL | 3 refills | Status: DC

## 2017-05-13 MED ORDER — METRONIDAZOLE 500 MG PO TABS: 500.0000 mg | ORAL_TABLET | Freq: Two times a day (BID) | ORAL | 0 refills | Status: DC

## 2017-05-13 MED ORDER — CIPROFLOXACIN HCL 500 MG PO TABS: 500.0000 mg | ORAL_TABLET | Freq: Two times a day (BID) | ORAL | 0 refills | Status: DC

## 2017-05-13 NOTE — Patient Instructions (Signed)
BRAT diet

## 2017-05-13 NOTE — Progress Notes (Signed)
BP 125/75   Pulse 64   Temp 98.8 F (37.1 C)   Wt 277 lb 6.4 oz (125.8 kg)   SpO2 98%   BMI 36.50 kg/m    Subjective:    Patient ID: Micheal Khan, male    DOB: [birth year], 56 y.o.   MRN: [id number]  HPI: Micheal Khan is a 56 y.o. male  Chief Complaint  Patient presents with  . GI Problem    pt states he has been having off and on stomach issues for the last 10 days, states somedays he had diarrhea but no fever (states he checked a few times)   Abdominal Pain  This is a new problem. Episode onset: about 10 days ago. The onset quality is sudden (attributes some of it to [place] ). Episode frequency: symptoms come and go. The pain is located in the LLQ. The pain is mild. The quality of the pain is colicky. The abdominal pain radiates to the LUQ. Associated symptoms include belching, diarrhea and frequency. Pertinent negatives include no fever, nausea, vomiting or weight loss. Relieved by: OTC antacids. He has tried proton pump inhibitors, H2 blockers and antacids for the symptoms. The treatment provided mild relief. His past medical history is significant for GERD. There is no history of abdominal surgery or irritable bowel syndrome.     Relevant past medical, surgical, family and social history reviewed and updated as indicated. Interim medical history since our last visit reviewed. Allergies and medications reviewed and updated.  Review of Systems  Constitutional: Negative for fever and weight loss.  Gastrointestinal: Positive for abdominal pain and diarrhea. Negative for nausea and vomiting.  Genitourinary: Positive for frequency.    Per HPI unless specifically indicated above     Objective:    BP 125/75   Pulse 64   Temp 98.8 F (37.1 C)   Wt 277 lb 6.4 oz (125.8 kg)   SpO2 98%   BMI 36.50 kg/m   Wt Readings from Last 3 Encounters:  05/13/17 277 lb 6.4 oz (125.8 kg)  03/14/17 277 lb 3.2 oz (125.7 kg)  02/25/16 279 lb 11.2 oz (126.9 kg)    Physical  Exam  Constitutional: He is oriented to person, place, and time. He appears well-developed and well-nourished. No distress.  HENT:  Head: Normocephalic and atraumatic.  Eyes: Conjunctivae and lids are normal. Right eye exhibits no discharge. Left eye exhibits no discharge. No scleral icterus.  Neck: Normal range of motion. Neck supple. No JVD present. Carotid bruit is not present.  Cardiovascular: Normal rate, regular rhythm and normal heart sounds.   Pulmonary/Chest: Effort normal and breath sounds normal. No respiratory distress.  Abdominal: Normal appearance. There is no splenomegaly or hepatomegaly.  Musculoskeletal: Normal range of motion.  Neurological: He is alert and oriented to person, place, and time.  Skin: Skin is warm, dry and intact. No rash noted. No pallor.  Psychiatric: He has a normal mood and affect. His behavior is normal. Judgment and thought content normal.    Results for orders placed or performed in visit on 03/14/17  CBC with Differential/Platelet  Result Value Ref Range   WBC 8.0 3.4 - 10.8 x10E3/uL   RBC 4.73 4.14 - 5.80 x10E6/uL   Hemoglobin 14.4 13.0 - 17.7 g/dL   Hematocrit 43.1 37.5 - 51.0 %   MCV 91 79 - 97 fL   MCH 30.4 26.6 - 33.0 pg   MCHC 33.4 31.5 - 35.7 g/dL   RDW 13.5 12.3 -  15.4 %   Platelets 237 150 - 379 x10E3/uL   Neutrophils 62 Not Estab. %   Lymphs 26 Not Estab. %   Monocytes 10 Not Estab. %   Eos 2 Not Estab. %   Basos 0 Not Estab. %   Neutrophils Absolute 4.9 1.4 - 7.0 x10E3/uL   Lymphocytes Absolute 2.1 0.7 - 3.1 x10E3/uL   Monocytes Absolute 0.8 0.1 - 0.9 x10E3/uL   EOS (ABSOLUTE) 0.2 0.0 - 0.4 x10E3/uL   Basophils Absolute 0.0 0.0 - 0.2 x10E3/uL   Immature Granulocytes 0 Not Estab. %   Immature Grans (Abs) 0.0 0.0 - 0.1 x10E3/uL  Comprehensive metabolic panel  Result Value Ref Range   Glucose 88 65 - 99 mg/dL   BUN 21 6 - 24 mg/dL   Creatinine, Ser 1.16 0.76 - 1.27 mg/dL   GFR calc non Af Amer 71 >59 mL/min/1.73   GFR calc  Af Amer 82 >59 mL/min/1.73   BUN/Creatinine Ratio 18 9 - 20   Sodium 141 134 - 144 mmol/L   Potassium 4.4 3.5 - 5.2 mmol/L   Chloride 104 96 - 106 mmol/L   CO2 22 18 - 29 mmol/L   Calcium 9.4 8.7 - 10.2 mg/dL   Total Protein 7.1 6.0 - 8.5 g/dL   Albumin 4.9 3.5 - 5.5 g/dL   Globulin, Total 2.2 1.5 - 4.5 g/dL   Albumin/Globulin Ratio 2.2 1.2 - 2.2   Bilirubin Total 0.3 0.0 - 1.2 mg/dL   Alkaline Phosphatase 61 39 - 117 IU/L   AST 29 0 - 40 IU/L   ALT 21 0 - 44 IU/L  Lipid Panel w/o Chol/HDL Ratio  Result Value Ref Range   Cholesterol, Total 216 (H) 100 - 199 mg/dL   Triglycerides 281 (H) 0 - 149 mg/dL   HDL 43 >39 mg/dL   VLDL Cholesterol Cal 56 (H) 5 - 40 mg/dL   LDL Calculated 117 (H) 0 - 99 mg/dL  PSA  Result Value Ref Range   Prostate Specific Ag, Serum 0.6 0.0 - 4.0 ng/mL  TSH  Result Value Ref Range   TSH 3.350 0.450 - 4.500 uIU/mL  AFP tumor marker  Result Value Ref Range   AFP-Tumor Marker 2.8 0.0 - 8.3 ng/mL  Lactate Dehydrogenase (LDH)  Result Value Ref Range   LDH 179 121 - 224 IU/L      Assessment & Plan:   Problem List Items Addressed This Visit      Unprioritized   Abdominal pain    Suspect food related.  Rx for Flagyl and Cipro if unable to improve with diet and rest       Other Visit Diagnoses    Diarrhea of presumed infectious origin    -  Primary   BRAT diet       Follow up plan: Return if symptoms worsen or fail to improve.

## 2017-05-13 NOTE — Assessment & Plan Note (Signed)
Suspect food related.  Rx for Flagyl and Cipro if unable to improve with diet and rest

## 2017-07-20 DIAGNOSIS — Z23 Encounter for immunization: Secondary | ICD-10-CM | POA: Diagnosis not present

## 2017-11-17 DIAGNOSIS — Z6833 Body mass index (BMI) 33.0-33.9, adult: Secondary | ICD-10-CM | POA: Diagnosis not present

## 2017-11-17 DIAGNOSIS — R030 Elevated blood-pressure reading, without diagnosis of hypertension: Secondary | ICD-10-CM | POA: Diagnosis not present

## 2017-11-25 ENCOUNTER — Encounter: Payer: Self-pay | Admitting: Family Medicine

## 2017-11-25 ENCOUNTER — Ambulatory Visit: Payer: BLUE CROSS/BLUE SHIELD | Admitting: Family Medicine

## 2017-11-25 VITALS — BP 130/81 | HR 67 | Temp 98.1°F | Wt 279.7 lb

## 2017-11-25 DIAGNOSIS — R509 Fever, unspecified: Secondary | ICD-10-CM | POA: Diagnosis not present

## 2017-11-25 LAB — VERITOR FLU A/B WAIVED
Influenza A: NEGATIVE
Influenza B: NEGATIVE

## 2017-11-25 MED ORDER — OSELTAMIVIR PHOSPHATE 75 MG PO CAPS: 75.0000 mg | ORAL_CAPSULE | Freq: Every day | ORAL | 0 refills | Status: DC

## 2017-11-25 NOTE — Patient Instructions (Signed)
Follow up as needed

## 2017-11-25 NOTE — Progress Notes (Signed)
BP 130/81 (BP Location: Left Arm, Patient Position: Sitting, Cuff Size: Normal)   Pulse 67   Temp 98.1 F (36.7 C) (Oral)   Wt 279 lb 11.2 oz (126.9 kg)   SpO2 96%   BMI 36.80 kg/m    Subjective:    Patient ID: Micheal Khan, male    DOB: Oct 01, 1961, 57 y.o.   MRN: [id number]  HPI: Micheal Khan is a 57 y.o. male  Chief Complaint  Patient presents with  . Flu Exposure    Son was sick, not swabbed for flu. Daughter became sick and tested positive for flu.  . Cough  . Nasal Congestion   Low grade fevers, cough, mild congestion, HA x 1 day. Son and daughter are both sick with flu. Denies CP, SOB, N/V, ear pain. Has not tried anything OTC. UTD on flu vaccine.   Relevant past medical, surgical, family and social history reviewed and updated as indicated. Interim medical history since our last visit reviewed. Allergies and medications reviewed and updated.  Review of Systems  Per HPI unless specifically indicated above     Objective:    BP 130/81 (BP Location: Left Arm, Patient Position: Sitting, Cuff Size: Normal)   Pulse 67   Temp 98.1 F (36.7 C) (Oral)   Wt 279 lb 11.2 oz (126.9 kg)   SpO2 96%   BMI 36.80 kg/m   Wt Readings from Last 3 Encounters:  11/25/17 279 lb 11.2 oz (126.9 kg)  05/13/17 277 lb 6.4 oz (125.8 kg)  03/14/17 277 lb 3.2 oz (125.7 kg)    Physical Exam  Constitutional: He is oriented to person, place, and time. He appears well-developed and well-nourished. No distress.  HENT:  Head: Atraumatic.  Right Ear: External ear normal.  Left Ear: External ear normal.  Nose: Nose normal.  Mouth/Throat: Oropharynx is clear and moist. No oropharyngeal exudate.  Eyes: Conjunctivae are normal. Pupils are equal, round, and reactive to light.  Neck: Normal range of motion. Neck supple.  Cardiovascular: Normal rate and normal heart sounds.  Pulmonary/Chest: Effort normal and breath sounds normal. No respiratory distress.  Musculoskeletal: Normal range of  motion.  Neurological: He is alert and oriented to person, place, and time.  Skin: Skin is warm and dry.  Psychiatric: He has a normal mood and affect. His behavior is normal.  Nursing note and vitals reviewed.   Results for orders placed or performed in visit on 03/14/17  CBC with Differential/Platelet  Result Value Ref Range   WBC 8.0 3.4 - 10.8 x10E3/uL   RBC 4.73 4.14 - 5.80 x10E6/uL   Hemoglobin 14.4 13.0 - 17.7 g/dL   Hematocrit 43.1 37.5 - 51.0 %   MCV 91 79 - 97 fL   MCH 30.4 26.6 - 33.0 pg   MCHC 33.4 31.5 - 35.7 g/dL   RDW 13.5 12.3 - 15.4 %   Platelets 237 150 - 379 x10E3/uL   Neutrophils 62 Not Estab. %   Lymphs 26 Not Estab. %   Monocytes 10 Not Estab. %   Eos 2 Not Estab. %   Basos 0 Not Estab. %   Neutrophils Absolute 4.9 1.4 - 7.0 x10E3/uL   Lymphocytes Absolute 2.1 0.7 - 3.1 x10E3/uL   Monocytes Absolute 0.8 0.1 - 0.9 x10E3/uL   EOS (ABSOLUTE) 0.2 0.0 - 0.4 x10E3/uL   Basophils Absolute 0.0 0.0 - 0.2 x10E3/uL   Immature Granulocytes 0 Not Estab. %   Immature Grans (Abs) 0.0 0.0 - 0.1 x10E3/uL  Comprehensive metabolic panel  Result Value Ref Range   Glucose 88 65 - 99 mg/dL   BUN 21 6 - 24 mg/dL   Creatinine, Ser 1.16 0.76 - 1.27 mg/dL   GFR calc non Af Amer 71 >59 mL/min/1.73   GFR calc Af Amer 82 >59 mL/min/1.73   BUN/Creatinine Ratio 18 9 - 20   Sodium 141 134 - 144 mmol/L   Potassium 4.4 3.5 - 5.2 mmol/L   Chloride 104 96 - 106 mmol/L   CO2 22 18 - 29 mmol/L   Calcium 9.4 8.7 - 10.2 mg/dL   Total Protein 7.1 6.0 - 8.5 g/dL   Albumin 4.9 3.5 - 5.5 g/dL   Globulin, Total 2.2 1.5 - 4.5 g/dL   Albumin/Globulin Ratio 2.2 1.2 - 2.2   Bilirubin Total 0.3 0.0 - 1.2 mg/dL   Alkaline Phosphatase 61 39 - 117 IU/L   AST 29 0 - 40 IU/L   ALT 21 0 - 44 IU/L  Lipid Panel w/o Chol/HDL Ratio  Result Value Ref Range   Cholesterol, Total 216 (H) 100 - 199 mg/dL   Triglycerides 281 (H) 0 - 149 mg/dL   HDL 43 >39 mg/dL   VLDL Cholesterol Cal 56 (H) 5 - 40 mg/dL    LDL Calculated 117 (H) 0 - 99 mg/dL  PSA  Result Value Ref Range   Prostate Specific Ag, Serum 0.6 0.0 - 4.0 ng/mL  TSH  Result Value Ref Range   TSH 3.350 0.450 - 4.500 uIU/mL  AFP tumor marker  Result Value Ref Range   AFP-Tumor Marker 2.8 0.0 - 8.3 ng/mL  Lactate Dehydrogenase (LDH)  Result Value Ref Range   LDH 179 121 - 224 IU/L      Assessment & Plan:   Problem List Items Addressed This Visit    None    Visit Diagnoses    Fever, unspecified fever cause    -  Primary   Low grade, minimal sick sxs starting but has exposures. Rapid flu neg, will start prophylactic tamiflu and monitor closely   Relevant Orders   Veritor Flu A/B Waived       Follow up plan: Return if symptoms worsen or fail to improve.

## 2018-03-15 ENCOUNTER — Ambulatory Visit (INDEPENDENT_AMBULATORY_CARE_PROVIDER_SITE_OTHER): Payer: BLUE CROSS/BLUE SHIELD | Admitting: Unknown Physician Specialty

## 2018-03-15 ENCOUNTER — Encounter: Payer: Self-pay | Admitting: Unknown Physician Specialty

## 2018-03-15 VITALS — BP 144/81 | HR 68 | Temp 98.6°F | Ht 72.6 in | Wt 277.0 lb

## 2018-03-15 DIAGNOSIS — K219 Gastro-esophageal reflux disease without esophagitis: Secondary | ICD-10-CM

## 2018-03-15 DIAGNOSIS — E669 Obesity, unspecified: Secondary | ICD-10-CM

## 2018-03-15 DIAGNOSIS — I10 Essential (primary) hypertension: Secondary | ICD-10-CM | POA: Diagnosis not present

## 2018-03-15 DIAGNOSIS — Z0000 Encounter for general adult medical examination without abnormal findings: Secondary | ICD-10-CM | POA: Diagnosis not present

## 2018-03-15 NOTE — Assessment & Plan Note (Signed)
Discussed taper of Omeprazole as tolerated

## 2018-03-15 NOTE — Assessment & Plan Note (Addendum)
Pt walks 3 to 3 1/2 miles/day.  States a good diet.

## 2018-03-15 NOTE — Patient Instructions (Signed)

## 2018-03-15 NOTE — Progress Notes (Signed)
   BP (!) 144/81   Pulse 68   Temp 98.6 F (37 C) (Oral)   Ht 6' 0.6" (1.844 m)   Wt 277 lb (125.6 kg)   SpO2 98%   BMI 36.95 kg/m    Subjective:    Patient ID: [name], male    DOB: 1961-05-17, 57 y.o.   MRN: [id number]  HPI: TAHEEM FRICKE is a 57 y.o. male  Chief Complaint  Patient presents with  . Annual Exam   GERD Started Omeprazole due to reflux.  Now taking taking it every other day with Zantac on opposite days.  Wonders if he should continue present treatment or taper further.    Hypertension Came down here to SBP in the 120s on second check Average home BPs 125-13-/83   No problems or lightheadedness No chest pain with exertion or shortness of breath No Edema  Relevant past medical, surgical, family and social history reviewed and updated as indicated. Interim medical history since our last visit reviewed. Allergies and medications reviewed and updated.  Review of Systems  Per HPI unless specifically indicated above     Objective:    BP (!) 144/81   Pulse 68   Temp 98.6 F (37 C) (Oral)   Ht 6' 0.6" (1.844 m)   Wt 277 lb (125.6 kg)   SpO2 98%   BMI 36.95 kg/m   Wt Readings from Last 3 Encounters:  03/15/18 277 lb (125.6 kg)  11/25/17 279 lb 11.2 oz (126.9 kg)  05/13/17 277 lb 6.4 oz (125.8 kg)    Physical Exam  Constitutional: He is oriented to person, place, and time. He appears well-developed and well-nourished.  HENT:  Head: Normocephalic.  Right Ear: Tympanic membrane, external ear and ear canal normal.  Left Ear: Tympanic membrane, external ear and ear canal normal.  Mouth/Throat: Uvula is midline, oropharynx is clear and moist and mucous membranes are normal.  Eyes: Pupils are equal, round, and reactive to light.  Cardiovascular: Normal rate, regular rhythm and normal heart sounds. Exam reveals no gallop and no friction rub.  No murmur heard. Pulmonary/Chest: Effort normal and breath sounds normal. No respiratory distress.    Abdominal: Soft. Bowel sounds are normal. He exhibits no distension. There is no tenderness.  Musculoskeletal: Normal range of motion.  Neurological: He is alert and oriented to person, place, and time. He has normal reflexes.  Skin: Skin is warm and dry.  Psychiatric: He has a normal mood and affect. His behavior is normal. Judgment and thought content normal.       Assessment & Plan:   Problem List Items Addressed This Visit      Unprioritized   Essential hypertension, benign    Stable and controlled without medication       GERD (gastroesophageal reflux disease)    Discussed taper of Omeprazole as tolerated      Obesity (BMI 35.0-39.9 without comorbidity)    Pt walks 3 to 3 1/2 miles/day.  States a good diet.         Other Visit Diagnoses    Annual physical exam    -  Primary   Relevant Orders   Lipid Panel w/o Chol/HDL Ratio   Comprehensive metabolic panel   TSH   PSA   CBC with Differential/Platelet       Follow up plan: Return in about 1 year (around 03/16/2019).

## 2018-03-15 NOTE — Assessment & Plan Note (Signed)
Stable and controlled without medication

## 2018-03-16 LAB — CBC WITH DIFFERENTIAL/PLATELET
Basophils Absolute: 0 10*3/uL (ref 0.0–0.2)
Basos: 0 %
EOS (ABSOLUTE): 0.2 10*3/uL (ref 0.0–0.4)
Eos: 4 %
Hematocrit: 40.8 % (ref 37.5–51.0)
Hemoglobin: 14 g/dL (ref 13.0–17.7)
Immature Grans (Abs): 0 10*3/uL (ref 0.0–0.1)
Immature Granulocytes: 0 %
Lymphocytes Absolute: 1.7 10*3/uL (ref 0.7–3.1)
Lymphs: 24 %
MCH: 30.5 pg (ref 26.6–33.0)
MCHC: 34.3 g/dL (ref 31.5–35.7)
MCV: 89 fL (ref 79–97)
Monocytes Absolute: 0.5 10*3/uL (ref 0.1–0.9)
Monocytes: 7 %
Neutrophils Absolute: 4.5 10*3/uL (ref 1.4–7.0)
Neutrophils: 65 %
Platelets: 216 10*3/uL (ref 150–450)
RBC: 4.59 x10E6/uL (ref 4.14–5.80)
RDW: 13.9 % (ref 12.3–15.4)
WBC: 6.9 10*3/uL (ref 3.4–10.8)

## 2018-03-16 LAB — LIPID PANEL W/O CHOL/HDL RATIO
Cholesterol, Total: 191 mg/dL (ref 100–199)
HDL: 38 mg/dL — ABNORMAL LOW (ref >39)
LDL Calculated: 107 mg/dL — ABNORMAL HIGH (ref 0–99)
Triglycerides: 230 mg/dL — ABNORMAL HIGH (ref 0–149)
VLDL Cholesterol Cal: 46 mg/dL — ABNORMAL HIGH (ref 5–40)

## 2018-03-16 LAB — PSA: Prostate Specific Ag, Serum: 0.5 ng/mL (ref 0.0–4.0)

## 2018-03-16 LAB — COMPREHENSIVE METABOLIC PANEL
ALT: 19 IU/L (ref 0–44)
AST: 23 IU/L (ref 0–40)
Albumin/Globulin Ratio: 2 (ref 1.2–2.2)
Albumin: 4.5 g/dL (ref 3.5–5.5)
Alkaline Phosphatase: 64 IU/L (ref 39–117)
BUN/Creatinine Ratio: 18 (ref 9–20)
BUN: 21 mg/dL (ref 6–24)
Bilirubin Total: 0.3 mg/dL (ref 0.0–1.2)
CO2: 20 mmol/L (ref 20–29)
Calcium: 9.5 mg/dL (ref 8.7–10.2)
Chloride: 105 mmol/L (ref 96–106)
Creatinine, Ser: 1.17 mg/dL (ref 0.76–1.27)
GFR calc Af Amer: 81 mL/min/{1.73_m2} (ref >59)
GFR calc non Af Amer: 70 mL/min/{1.73_m2} (ref >59)
Globulin, Total: 2.3 g/dL (ref 1.5–4.5)
Glucose: 86 mg/dL (ref 65–99)
Potassium: 4.2 mmol/L (ref 3.5–5.2)
Sodium: 141 mmol/L (ref 134–144)
Total Protein: 6.8 g/dL (ref 6.0–8.5)

## 2018-03-16 LAB — TSH: TSH: 3.41 u[IU]/mL (ref 0.450–4.500)

## 2018-03-16 NOTE — Progress Notes (Signed)
Notified pt by mychart

## 2018-03-21 DIAGNOSIS — D229 Melanocytic nevi, unspecified: Secondary | ICD-10-CM | POA: Diagnosis not present

## 2018-03-21 DIAGNOSIS — L578 Other skin changes due to chronic exposure to nonionizing radiation: Secondary | ICD-10-CM | POA: Diagnosis not present

## 2018-03-21 DIAGNOSIS — L918 Other hypertrophic disorders of the skin: Secondary | ICD-10-CM | POA: Diagnosis not present

## 2018-03-21 DIAGNOSIS — L821 Other seborrheic keratosis: Secondary | ICD-10-CM | POA: Diagnosis not present

## 2018-06-01 ENCOUNTER — Ambulatory Visit (INDEPENDENT_AMBULATORY_CARE_PROVIDER_SITE_OTHER): Payer: BLUE CROSS/BLUE SHIELD | Admitting: Family Medicine

## 2018-06-01 VITALS — BP 152/88 | HR 70 | Wt 273.0 lb

## 2018-06-01 DIAGNOSIS — R197 Diarrhea, unspecified: Secondary | ICD-10-CM

## 2018-06-01 DIAGNOSIS — R109 Unspecified abdominal pain: Secondary | ICD-10-CM | POA: Diagnosis not present

## 2018-06-01 DIAGNOSIS — Z23 Encounter for immunization: Secondary | ICD-10-CM

## 2018-06-01 MED ORDER — CIPROFLOXACIN HCL 250 MG PO TABS: 250.0000 mg | ORAL_TABLET | Freq: Two times a day (BID) | ORAL | 0 refills | Status: DC

## 2018-06-01 NOTE — Patient Instructions (Addendum)
Twice daily probiotics while on the cipro BRAT DIET Drink lots of fluids Imodium as needed   Influenza (Flu) Vaccine   (Inactivated or Recombinant): What You Need to Know 1. Why get vaccinated? Influenza ("flu") is a contagious disease that spreads around the [place] every year, usually between October and May. Flu is caused by influenza viruses, and is spread mainly by coughing, sneezing, and close contact. Anyone can get flu. Flu strikes suddenly and can last several days. Symptoms vary by age, but can include:  fever/chills  sore throat  muscle aches  fatigue  cough  headache  runny or stuffy nose  Flu can also lead to pneumonia and blood infections, and cause diarrhea and seizures in children. If you have a medical condition, such as heart or lung disease, flu can make it worse. Flu is more dangerous for some people. Infants and young children, people 55 years of age and older, pregnant women, and people with certain health conditions or a weakened immune system are at greatest risk. Each year thousands of people in the [place] die from flu, and many more are hospitalized. Flu vaccine can:  keep you from getting flu,  make flu less severe if you do get it, and  keep you from spreading flu to your family and other people. 2. Inactivated and recombinant flu vaccines A dose of flu vaccine is recommended every flu season. Children 6 months through 12 years of age may need two doses during the same flu season. Everyone else needs only one dose each flu season. Some inactivated flu vaccines contain a very small amount of a mercury-based preservative called thimerosal. Studies have not shown thimerosal in vaccines to be harmful, but flu vaccines that do not contain thimerosal are available. There is no live flu virus in flu shots. They cannot cause the flu. There are many flu viruses, and they are always changing. Each year a new flu vaccine is made to protect  against three or four viruses that are likely to cause disease in the upcoming flu season. But even when the vaccine doesn't exactly match these viruses, it may still provide some protection. Flu vaccine cannot prevent:  flu that is caused by a virus not covered by the vaccine, or  illnesses that look like flu but are not.  It takes about 2 weeks for protection to develop after vaccination, and protection lasts through the flu season. 3. Some people should not get this vaccine Tell the person who is giving you the vaccine:  If you have any severe, life-threatening allergies. If you ever had a life-threatening allergic reaction after a dose of flu vaccine, or have a severe allergy to any part of this vaccine, you may be advised not to get vaccinated. Most, but not all, types of flu vaccine contain a small amount of egg protein.  If you ever had Guillain-Barr Syndrome (also called GBS). Some people with a history of GBS should not get this vaccine. This should be discussed with your doctor.  If you are not feeling well. It is usually okay to get flu vaccine when you have a mild illness, but you might be asked to come back when you feel better.  4. Risks of a vaccine reaction With any medicine, including vaccines, there is a chance of reactions. These are usually mild and go away on their own, but serious reactions are also possible. Most people who get a flu shot do not have any problems with it. Minor  problems following a flu shot include:  soreness, redness, or swelling where the shot was given  hoarseness  sore, red or itchy eyes  cough  fever  aches  headache  itching  fatigue  If these problems occur, they usually begin soon after the shot and last 1 or 2 days. More serious problems following a flu shot can include the following:  There may be a small increased risk of Guillain-Barre Syndrome (GBS) after inactivated flu vaccine. This risk has been estimated at 1 or 2  additional cases per million people vaccinated. This is much lower than the risk of severe complications from flu, which can be prevented by flu vaccine.  Young children who get the flu shot along with pneumococcal vaccine (PCV13) and/or DTaP vaccine at the same time might be slightly more likely to have a seizure caused by fever. Ask your doctor for more information. Tell your doctor if a child who is getting flu vaccine has ever had a seizure.  Problems that could happen after any injected vaccine:  People sometimes faint after a medical procedure, including vaccination. Sitting or lying down for about 15 minutes can help prevent fainting, and injuries caused by a fall. Tell your doctor if you feel dizzy, or have vision changes or ringing in the ears.  Some people get severe pain in the shoulder and have difficulty moving the arm where a shot was given. This happens very rarely.  Any medication can cause a severe allergic reaction. Such reactions from a vaccine are very rare, estimated at about 1 in a million doses, and would happen within a few minutes to a few hours after the vaccination. As with any medicine, there is a very remote chance of a vaccine causing a serious injury or death. The safety of vaccines is always being monitored. For more information, visit: http://www.aguilar.org/ 5. What if there is a serious reaction? What should I look for? Look for anything that concerns you, such as signs of a severe allergic reaction, very high fever, or unusual behavior. Signs of a severe allergic reaction can include hives, swelling of the face and throat, difficulty breathing, a fast heartbeat, dizziness, and weakness. These would start a few minutes to a few hours after the vaccination. What should I do?  If you think it is a severe allergic reaction or other emergency that can't wait, call 9-1-1 and get the person to the nearest hospital. Otherwise, call your doctor.  Reactions should be  reported to the Vaccine Adverse Event Reporting System (VAERS). Your doctor should file this report, or you can do it yourself through the VAERS web site at www.vaers.SamedayNews.es, or by calling (787)597-2104. ? VAERS does not give medical advice. 6. The National Vaccine Injury Compensation Program The [employer] Vaccine Injury Compensation Program (VICP) is a federal program that was created to compensate people who may have been injured by certain vaccines. Persons who believe they may have been injured by a vaccine can learn about the program and about filing a claim by calling 320-658-1230 or visiting the [place] website at GoldCloset.com.ee. There is a time limit to file a claim for compensation. 7. How can I learn more?  Ask your healthcare provider. He or she can give you the vaccine package insert or suggest other sources of information.  Call your local or state health department.  Contact the Centers for Disease Control and Prevention (CDC): ? Call 337-610-5825 (1-800-CDC-INFO) or ? Visit CDC's website at https://gibson.com/ Vaccine Information Statement, Inactivated Influenza  Vaccine (05/17/2014) This information is not intended to replace advice given to you by your health care provider. Make sure you discuss any questions you have with your health care provider. Document Released: 07/22/2006 Document Revised: 06/17/2016 Document Reviewed: 06/17/2016 Elsevier Interactive Patient Education  2017 [employer].

## 2018-06-01 NOTE — Progress Notes (Signed)
BP (!) 152/88   Pulse 70   Wt 273 lb (123.8 kg)   SpO2 97%   BMI 36.42 kg/m    Subjective:    Patient ID: Micheal Khan, male    DOB: 1961-02-14, 57 y.o.   MRN: [id number]  HPI: [name] is a 57 y.o. male  Chief Complaint  Patient presents with  . Referral    To Gastro. Seen [facility] Previously.    3 weeks of diarrhea, grey brown stools, and generalized dull abdominal discomfort intermittently. Has hx of gastritis, previously on prilosec but stopped that back in May. Restarted 2 weeks ago. No N/V, urinary sxs, fevers, anorexia, melena, recent travel, new supplements, new foods. Normally just has one BM in the morning, now having two daily and they are always loose.   Relevant past medical, surgical, family and social history reviewed and updated as indicated. Interim medical history since our last visit reviewed. Allergies and medications reviewed and updated.  Review of Systems  Per HPI unless specifically indicated above     Objective:    BP (!) 152/88   Pulse 70   Wt 273 lb (123.8 kg)   SpO2 97%   BMI 36.42 kg/m   Wt Readings from Last 3 Encounters:  06/01/18 273 lb (123.8 kg)  03/15/18 277 lb (125.6 kg)  11/25/17 279 lb 11.2 oz (126.9 kg)    Physical Exam  Constitutional: He is oriented to person, place, and time. He appears well-developed and well-nourished. No distress.  HENT:  Head: Atraumatic.  Eyes: Conjunctivae and EOM are normal.  Neck: Normal range of motion. Neck supple.  Cardiovascular: Normal rate and regular rhythm.  Pulmonary/Chest: Effort normal and breath sounds normal.  Abdominal: Soft. Bowel sounds are normal. He exhibits no distension and no mass. There is no tenderness. There is no rebound and no guarding.  Musculoskeletal: Normal range of motion. He exhibits no tenderness (no cva ttp b/l).  Neurological: He is alert and oriented to person, place, and time.  Skin: Skin is warm and dry.  Psychiatric: He has a normal mood and  affect. His behavior is normal.  Nursing note and vitals reviewed.   Results for orders placed or performed in visit on 06/01/18  Comprehensive metabolic panel  Result Value Ref Range   Glucose 83 65 - 99 mg/dL   BUN 13 6 - 24 mg/dL   Creatinine, Ser 1.08 0.76 - 1.27 mg/dL   GFR calc non Af Amer 77 >59 mL/min/1.73   GFR calc Af Amer 89 >59 mL/min/1.73   BUN/Creatinine Ratio 12 9 - 20   Sodium 144 134 - 144 mmol/L   Potassium 4.3 3.5 - 5.2 mmol/L   Chloride 107 (H) 96 - 106 mmol/L   CO2 22 20 - 29 mmol/L   Calcium 9.5 8.7 - 10.2 mg/dL   Total Protein 6.6 6.0 - 8.5 g/dL   Albumin 4.6 3.5 - 5.5 g/dL   Globulin, Total 2.0 1.5 - 4.5 g/dL   Albumin/Globulin Ratio 2.3 (H) 1.2 - 2.2   Bilirubin Total 0.4 0.0 - 1.2 mg/dL   Alkaline Phosphatase 61 39 - 117 IU/L   AST 21 0 - 40 IU/L   ALT 19 0 - 44 IU/L  CBC with Differential/Platelet  Result Value Ref Range   WBC 4.8 3.4 - 10.8 x10E3/uL   RBC 4.58 4.14 - 5.80 x10E6/uL   Hemoglobin 14.2 13.0 - 17.7 g/dL   Hematocrit 42.5 37.5 - 51.0 %  MCV 93 79 - 97 fL   MCH 31.0 26.6 - 33.0 pg   MCHC 33.4 31.5 - 35.7 g/dL   RDW 13.6 12.3 - 15.4 %   Platelets 222 150 - 450 x10E3/uL   Neutrophils 55 Not Estab. %   Lymphs 31 Not Estab. %   Monocytes 9 Not Estab. %   Eos 4 Not Estab. %   Basos 1 Not Estab. %   Neutrophils Absolute 2.7 1.4 - 7.0 x10E3/uL   Lymphocytes Absolute 1.5 0.7 - 3.1 x10E3/uL   Monocytes Absolute 0.4 0.1 - 0.9 x10E3/uL   EOS (ABSOLUTE) 0.2 0.0 - 0.4 x10E3/uL   Basophils Absolute 0.0 0.0 - 0.2 x10E3/uL   Immature Granulocytes 0 Not Estab. %   Immature Grans (Abs) 0.0 0.0 - 0.1 x10E3/uL  Lipase  Result Value Ref Range   Lipase 18 13 - 78 U/L      Assessment & Plan:   Problem List Items Addressed This Visit      Other   Abdominal pain - Primary    Exam and vitals benign today, will await labs and stool studies. Start course of cipro in case infectious. Low suspicion for diverticulitis but await CBC. Continue  prilosec, zantac prn. Imodium for diarrhea. Push fluids. Start probiotics, samples given.       Relevant Orders   Comprehensive metabolic panel (Completed)   CBC with Differential/Platelet (Completed)   Lipase (Completed)    Other Visit Diagnoses    Needs flu shot       Relevant Orders   Flu Vaccine QUAD 6+ mos PF IM (Fluarix Quad PF) (Completed)   Diarrhea, unspecified type       Relevant Orders   Ova and parasite examination   Fecal leukocytes       Follow up plan: Return if symptoms worsen or fail to improve.

## 2018-06-02 DIAGNOSIS — R197 Diarrhea, unspecified: Secondary | ICD-10-CM | POA: Diagnosis not present

## 2018-06-02 LAB — CBC WITH DIFFERENTIAL/PLATELET
Basophils Absolute: 0 10*3/uL (ref 0.0–0.2)
Basos: 1 %
EOS (ABSOLUTE): 0.2 10*3/uL (ref 0.0–0.4)
Eos: 4 %
Hematocrit: 42.5 % (ref 37.5–51.0)
Hemoglobin: 14.2 g/dL (ref 13.0–17.7)
Immature Grans (Abs): 0 10*3/uL (ref 0.0–0.1)
Immature Granulocytes: 0 %
Lymphocytes Absolute: 1.5 10*3/uL (ref 0.7–3.1)
Lymphs: 31 %
MCH: 31 pg (ref 26.6–33.0)
MCHC: 33.4 g/dL (ref 31.5–35.7)
MCV: 93 fL (ref 79–97)
Monocytes Absolute: 0.4 10*3/uL (ref 0.1–0.9)
Monocytes: 9 %
Neutrophils Absolute: 2.7 10*3/uL (ref 1.4–7.0)
Neutrophils: 55 %
Platelets: 222 10*3/uL (ref 150–450)
RBC: 4.58 x10E6/uL (ref 4.14–5.80)
RDW: 13.6 % (ref 12.3–15.4)
WBC: 4.8 10*3/uL (ref 3.4–10.8)

## 2018-06-02 LAB — COMPREHENSIVE METABOLIC PANEL
ALT: 19 IU/L (ref 0–44)
AST: 21 IU/L (ref 0–40)
Albumin/Globulin Ratio: 2.3 — ABNORMAL HIGH (ref 1.2–2.2)
Albumin: 4.6 g/dL (ref 3.5–5.5)
Alkaline Phosphatase: 61 IU/L (ref 39–117)
BUN/Creatinine Ratio: 12 (ref 9–20)
BUN: 13 mg/dL (ref 6–24)
Bilirubin Total: 0.4 mg/dL (ref 0.0–1.2)
CO2: 22 mmol/L (ref 20–29)
Calcium: 9.5 mg/dL (ref 8.7–10.2)
Chloride: 107 mmol/L — ABNORMAL HIGH (ref 96–106)
Creatinine, Ser: 1.08 mg/dL (ref 0.76–1.27)
GFR calc Af Amer: 89 mL/min/{1.73_m2} (ref >59)
GFR calc non Af Amer: 77 mL/min/{1.73_m2} (ref >59)
Globulin, Total: 2 g/dL (ref 1.5–4.5)
Glucose: 83 mg/dL (ref 65–99)
Potassium: 4.3 mmol/L (ref 3.5–5.2)
Sodium: 144 mmol/L (ref 134–144)
Total Protein: 6.6 g/dL (ref 6.0–8.5)

## 2018-06-02 LAB — LIPASE: Lipase: 18 U/L (ref 13–78)

## 2018-06-04 NOTE — Assessment & Plan Note (Signed)
Exam and vitals benign today, will await labs and stool studies. Start course of cipro in case infectious. Low suspicion for diverticulitis but await CBC. Continue prilosec, zantac prn. Imodium for diarrhea. Push fluids. Start probiotics, samples given.

## 2018-06-06 LAB — OVA AND PARASITE EXAMINATION

## 2018-06-08 LAB — FECAL LEUKOCYTES

## 2018-06-09 ENCOUNTER — Encounter: Payer: Self-pay | Admitting: Family Medicine

## 2018-06-09 ENCOUNTER — Other Ambulatory Visit: Payer: Self-pay | Admitting: Family Medicine

## 2018-06-09 DIAGNOSIS — R197 Diarrhea, unspecified: Secondary | ICD-10-CM

## 2018-06-13 LAB — SPECIMEN STATUS REPORT

## 2018-06-13 LAB — FECAL LEUKOCYTES

## 2018-06-20 DIAGNOSIS — K529 Noninfective gastroenteritis and colitis, unspecified: Secondary | ICD-10-CM | POA: Diagnosis not present

## 2018-07-20 ENCOUNTER — Other Ambulatory Visit: Payer: Self-pay

## 2018-07-20 ENCOUNTER — Encounter: Payer: Self-pay | Admitting: Family Medicine

## 2018-07-20 ENCOUNTER — Ambulatory Visit: Payer: BLUE CROSS/BLUE SHIELD | Admitting: Family Medicine

## 2018-07-20 VITALS — BP 159/89 | HR 69 | Temp 98.6°F | Ht 72.0 in | Wt 270.0 lb

## 2018-07-20 DIAGNOSIS — R0789 Other chest pain: Secondary | ICD-10-CM

## 2018-07-20 DIAGNOSIS — F419 Anxiety disorder, unspecified: Secondary | ICD-10-CM

## 2018-07-20 DIAGNOSIS — K219 Gastro-esophageal reflux disease without esophagitis: Secondary | ICD-10-CM

## 2018-07-20 DIAGNOSIS — I10 Essential (primary) hypertension: Secondary | ICD-10-CM

## 2018-07-20 MED ORDER — LISINOPRIL 5 MG PO TABS: 5.0000 mg | ORAL_TABLET | Freq: Every day | ORAL | 3 refills | Status: DC

## 2018-07-20 NOTE — Progress Notes (Signed)
BP (!) 159/89   Pulse 69   Temp 98.6 F (37 C) (Oral)   Ht 6' (1.829 m)   Wt 270 lb (122.5 kg)   SpO2 97%   BMI 36.62 kg/m    Subjective:    Patient ID: Micheal Khan, male    DOB: 1960-11-17, 57 y.o.   MRN: [id number]  HPI: Micheal Khan is a 57 y.o. male  Chief Complaint  Patient presents with  . Hypertension    pt states his BP has been high lately/states he feels sick  . Other    pt states has concerns about his heart and acid reflux   Here today with concerns about some elevated BPs he's been having. Tracks his BPs several times daily and notes his morning readings are typically above average in the 130s-150s/80s and then it'll come down during the day once he exercises and gets going. Sometimes having some chest discomfort but is also dealing with reflux issues so unsure if it's related. Feels very anxious about his health given all the recent sxs he's been having. Denies fevers, SOB, syncope, dizziness, N/V. Does have a fhx of heart disease, father had an MI.   Relevant past medical, surgical, family and social history reviewed and updated as indicated. Interim medical history since our last visit reviewed. Allergies and medications reviewed and updated.  Review of Systems  Per HPI unless specifically indicated above     Objective:    BP (!) 159/89   Pulse 69   Temp 98.6 F (37 C) (Oral)   Ht 6' (1.829 m)   Wt 270 lb (122.5 kg)   SpO2 97%   BMI 36.62 kg/m   Wt Readings from Last 3 Encounters:  07/20/18 270 lb (122.5 kg)  06/01/18 273 lb (123.8 kg)  03/15/18 277 lb (125.6 kg)    Physical Exam  Constitutional: He is oriented to person, place, and time. He appears well-developed and well-nourished. No distress.  HENT:  Head: Atraumatic.  Eyes: Conjunctivae and EOM are normal.  Neck: Normal range of motion. Neck supple.  Cardiovascular: Normal rate and regular rhythm.  Pulmonary/Chest: Effort normal and breath sounds normal.  Abdominal: Soft. Bowel  sounds are normal. He exhibits no distension. There is no tenderness.  Musculoskeletal: Normal range of motion.  Neurological: He is alert and oriented to person, place, and time.  Skin: Skin is warm and dry.  Psychiatric: He has a normal mood and affect. His behavior is normal.  Nursing note and vitals reviewed.   Results for orders placed or performed in visit on 06/01/18  Ova and parasite examination  Result Value Ref Range   OVA + PARASITE EXAM Final report    O&P result 1 Comment   Fecal leukocytes  Result Value Ref Range   White Blood Cells (WBC), Stool CANCELED   Fecal leukocytes  Result Value Ref Range   White Blood Cells (WBC), Stool Final report None Seen   Result 1 Comment   Comprehensive metabolic panel  Result Value Ref Range   Glucose 83 65 - 99 mg/dL   BUN 13 6 - 24 mg/dL   Creatinine, Ser 1.08 0.76 - 1.27 mg/dL   GFR calc non Af Amer 77 >59 mL/min/1.73   GFR calc Af Amer 89 >59 mL/min/1.73   BUN/Creatinine Ratio 12 9 - 20   Sodium 144 134 - 144 mmol/L   Potassium 4.3 3.5 - 5.2 mmol/L   Chloride 107 (H) 96 - 106 mmol/L  CO2 22 20 - 29 mmol/L   Calcium 9.5 8.7 - 10.2 mg/dL   Total Protein 6.6 6.0 - 8.5 g/dL   Albumin 4.6 3.5 - 5.5 g/dL   Globulin, Total 2.0 1.5 - 4.5 g/dL   Albumin/Globulin Ratio 2.3 (H) 1.2 - 2.2   Bilirubin Total 0.4 0.0 - 1.2 mg/dL   Alkaline Phosphatase 61 39 - 117 IU/L   AST 21 0 - 40 IU/L   ALT 19 0 - 44 IU/L  CBC with Differential/Platelet  Result Value Ref Range   WBC 4.8 3.4 - 10.8 x10E3/uL   RBC 4.58 4.14 - 5.80 x10E6/uL   Hemoglobin 14.2 13.0 - 17.7 g/dL   Hematocrit 42.5 37.5 - 51.0 %   MCV 93 79 - 97 fL   MCH 31.0 26.6 - 33.0 pg   MCHC 33.4 31.5 - 35.7 g/dL   RDW 13.6 12.3 - 15.4 %   Platelets 222 150 - 450 x10E3/uL   Neutrophils 55 Not Estab. %   Lymphs 31 Not Estab. %   Monocytes 9 Not Estab. %   Eos 4 Not Estab. %   Basos 1 Not Estab. %   Neutrophils Absolute 2.7 1.4 - 7.0 x10E3/uL   Lymphocytes Absolute 1.5 0.7  - 3.1 x10E3/uL   Monocytes Absolute 0.4 0.1 - 0.9 x10E3/uL   EOS (ABSOLUTE) 0.2 0.0 - 0.4 x10E3/uL   Basophils Absolute 0.0 0.0 - 0.2 x10E3/uL   Immature Granulocytes 0 Not Estab. %   Immature Grans (Abs) 0.0 0.0 - 0.1 x10E3/uL  Lipase  Result Value Ref Range   Lipase 18 13 - 78 U/L  Specimen status report  Result Value Ref Range   specimen status report Comment       Assessment & Plan:   Problem List Items Addressed This Visit      Cardiovascular and Mediastinum   Essential hypertension, benign - Primary    Variable readings, but typically WNL. Given pt concern and occasional high readings, will start 5 mg lisinopril and continue working on lifestyle modifications. Risks and side effects reviewed. Discussed not checking readings so often as it can cause some increased anxiety to fixate on the numbers throughout the day      Relevant Medications   lisinopril (PRINIVIL,ZESTRIL) 5 MG tablet     Digestive   GERD (gastroesophageal reflux disease)    Currently taking prilosec every other day alternating with zantac. Will try going back to daily on prilosec for one month and then tapering back down. Diet modifications reviewed      Relevant Medications   Probiotic Product (PROBIOTIC DAILY PO)     Other   Anxiety    Suspect many of his sxs are perpetuated from anxiety regarding his health. Pt endorses this as a strong possibility when discussed. Will get BPs under a bit better control and see if this helps reduce his anxiousness. Discussed starting some low dose SSRIs and pt is interested if things don't improve over the next month       Other Visit Diagnoses    Chest discomfort       EKG without any acute changes, sinus rhythm at 59 bmp without ST or T wave abnormalities. Reassured pt, and offered Cardiology referral if wanting further r/o   Relevant Orders   EKG 12-Lead (Completed)       Follow up plan: Return in about 4 weeks (around 08/17/2018) for BP check.

## 2018-07-21 DIAGNOSIS — Z6834 Body mass index (BMI) 34.0-34.9, adult: Secondary | ICD-10-CM | POA: Diagnosis not present

## 2018-07-21 DIAGNOSIS — I10 Essential (primary) hypertension: Secondary | ICD-10-CM | POA: Diagnosis not present

## 2018-07-23 DIAGNOSIS — F419 Anxiety disorder, unspecified: Secondary | ICD-10-CM | POA: Insufficient documentation

## 2018-07-23 NOTE — Patient Instructions (Signed)
Follow up in 1 month   

## 2018-07-23 NOTE — Assessment & Plan Note (Signed)
Suspect many of his sxs are perpetuated from anxiety regarding his health. Pt endorses this as a strong possibility when discussed. Will get BPs under a bit better control and see if this helps reduce his anxiousness. Discussed starting some low dose SSRIs and pt is interested if things don't improve over the next month

## 2018-07-23 NOTE — Assessment & Plan Note (Signed)
Variable readings, but typically WNL. Given pt concern and occasional high readings, will start 5 mg lisinopril and continue working on lifestyle modifications. Risks and side effects reviewed. Discussed not checking readings so often as it can cause some increased anxiety to fixate on the numbers throughout the day

## 2018-07-23 NOTE — Assessment & Plan Note (Signed)
Currently taking prilosec every other day alternating with zantac. Will try going back to daily on prilosec for one month and then tapering back down. Diet modifications reviewed

## 2018-08-17 ENCOUNTER — Ambulatory Visit: Payer: BLUE CROSS/BLUE SHIELD | Admitting: Family Medicine

## 2018-08-17 ENCOUNTER — Encounter: Payer: Self-pay | Admitting: Family Medicine

## 2018-08-17 VITALS — BP 138/85 | HR 61 | Temp 97.7°F | Wt 263.0 lb

## 2018-08-17 DIAGNOSIS — I10 Essential (primary) hypertension: Secondary | ICD-10-CM

## 2018-08-17 NOTE — Progress Notes (Signed)
BP 138/85   Pulse 61   Temp 97.7 F (36.5 C) (Oral)   Wt 263 lb (119.3 kg)   SpO2 98%   BMI 35.67 kg/m    Subjective:    Patient ID: Micheal Khan, male    DOB: 1960/11/12, 57 y.o.   MRN: [id number]  HPI: Micheal Khan is a 57 y.o. male  Chief Complaint  Patient presents with  . Hypertension   Here today for BP f/u. Feeling much better overall since adding 5 mg lisinopril. Home readings 120s-130s/80s. Denies side effects, hypotension. States his anxiety and chest pain also seems to have improved since starting the medication as well since he's feeling less concerned about his health. Working hard on diet and exercise. Denies CP, SOB, dizziness, HAs.    Relevant past medical, surgical, family and social history reviewed and updated as indicated. Interim medical history since our last visit reviewed. Allergies and medications reviewed and updated.  Review of Systems  Per HPI unless specifically indicated above     Objective:    BP 138/85   Pulse 61   Temp 97.7 F (36.5 C) (Oral)   Wt 263 lb (119.3 kg)   SpO2 98%   BMI 35.67 kg/m   Wt Readings from Last 3 Encounters:  08/17/18 263 lb (119.3 kg)  07/20/18 270 lb (122.5 kg)  06/01/18 273 lb (123.8 kg)    Physical Exam  Constitutional: He is oriented to person, place, and time. He appears well-developed and well-nourished. No distress.  HENT:  Head: Atraumatic.  Eyes: Conjunctivae and EOM are normal.  Neck: Normal range of motion. Neck supple.  Cardiovascular: Normal rate and regular rhythm.  Pulmonary/Chest: Effort normal and breath sounds normal. No respiratory distress.  Musculoskeletal: Normal range of motion.  Neurological: He is alert and oriented to person, place, and time.  Skin: Skin is warm and dry.  Psychiatric: He has a normal mood and affect. His behavior is normal.  Nursing note and vitals reviewed.   Results for orders placed or performed in visit on 06/01/18  Ova and parasite examination    Result Value Ref Range   OVA + PARASITE EXAM Final report    O&P result 1 Comment   Fecal leukocytes  Result Value Ref Range   White Blood Cells (WBC), Stool CANCELED   Fecal leukocytes  Result Value Ref Range   White Blood Cells (WBC), Stool Final report None Seen   Result 1 Comment   Comprehensive metabolic panel  Result Value Ref Range   Glucose 83 65 - 99 mg/dL   BUN 13 6 - 24 mg/dL   Creatinine, Ser 1.08 0.76 - 1.27 mg/dL   GFR calc non Af Amer 77 >59 mL/min/1.73   GFR calc Af Amer 89 >59 mL/min/1.73   BUN/Creatinine Ratio 12 9 - 20   Sodium 144 134 - 144 mmol/L   Potassium 4.3 3.5 - 5.2 mmol/L   Chloride 107 (H) 96 - 106 mmol/L   CO2 22 20 - 29 mmol/L   Calcium 9.5 8.7 - 10.2 mg/dL   Total Protein 6.6 6.0 - 8.5 g/dL   Albumin 4.6 3.5 - 5.5 g/dL   Globulin, Total 2.0 1.5 - 4.5 g/dL   Albumin/Globulin Ratio 2.3 (H) 1.2 - 2.2   Bilirubin Total 0.4 0.0 - 1.2 mg/dL   Alkaline Phosphatase 61 39 - 117 IU/L   AST 21 0 - 40 IU/L   ALT 19 0 - 44 IU/L  CBC with Differential/Platelet  Result Value Ref Range   WBC 4.8 3.4 - 10.8 x10E3/uL   RBC 4.58 4.14 - 5.80 x10E6/uL   Hemoglobin 14.2 13.0 - 17.7 g/dL   Hematocrit 42.5 37.5 - 51.0 %   MCV 93 79 - 97 fL   MCH 31.0 26.6 - 33.0 pg   MCHC 33.4 31.5 - 35.7 g/dL   RDW 13.6 12.3 - 15.4 %   Platelets 222 150 - 450 x10E3/uL   Neutrophils 55 Not Estab. %   Lymphs 31 Not Estab. %   Monocytes 9 Not Estab. %   Eos 4 Not Estab. %   Basos 1 Not Estab. %   Neutrophils Absolute 2.7 1.4 - 7.0 x10E3/uL   Lymphocytes Absolute 1.5 0.7 - 3.1 x10E3/uL   Monocytes Absolute 0.4 0.1 - 0.9 x10E3/uL   EOS (ABSOLUTE) 0.2 0.0 - 0.4 x10E3/uL   Basophils Absolute 0.0 0.0 - 0.2 x10E3/uL   Immature Granulocytes 0 Not Estab. %   Immature Grans (Abs) 0.0 0.0 - 0.1 x10E3/uL  Lipase  Result Value Ref Range   Lipase 18 13 - 78 U/L  Specimen status report  Result Value Ref Range   specimen status report Comment       Assessment & Plan:   Problem  List Items Addressed This Visit      Cardiovascular and Mediastinum   Essential hypertension, benign - Primary    BPs consistently within range on low dose lisinopril. Continue current regimen and lifestyle modifications.       Relevant Orders   Basic Metabolic Panel (BMET)       Follow up plan: Return in about 6 months (around 02/15/2019) for HTN.

## 2018-08-17 NOTE — Patient Instructions (Signed)
Follow up in 6 months 

## 2018-08-17 NOTE — Assessment & Plan Note (Signed)
BPs consistently within range on low dose lisinopril. Continue current regimen and lifestyle modifications.

## 2018-08-18 LAB — BASIC METABOLIC PANEL
BUN/Creatinine Ratio: 15 (ref 9–20)
BUN: 16 mg/dL (ref 6–24)
CO2: 22 mmol/L (ref 20–29)
Calcium: 9.6 mg/dL (ref 8.7–10.2)
Chloride: 103 mmol/L (ref 96–106)
Creatinine, Ser: 1.08 mg/dL (ref 0.76–1.27)
GFR calc Af Amer: 89 mL/min/{1.73_m2} (ref >59)
GFR calc non Af Amer: 77 mL/min/{1.73_m2} (ref >59)
Glucose: 92 mg/dL (ref 65–99)
Potassium: 4.4 mmol/L (ref 3.5–5.2)
Sodium: 140 mmol/L (ref 134–144)

## 2018-10-12 ENCOUNTER — Ambulatory Visit: Payer: Self-pay

## 2018-10-12 MED ORDER — OSELTAMIVIR PHOSPHATE 75 MG PO CAPS: 75.0000 mg | ORAL_CAPSULE | Freq: Every day | ORAL | 0 refills | Status: DC

## 2018-10-12 NOTE — Telephone Encounter (Signed)
Sent over preventative dosing of tamiflu for him

## 2018-10-12 NOTE — Telephone Encounter (Signed)
Rx resent in 

## 2018-10-12 NOTE — Telephone Encounter (Signed)
Message relayed to patient. Verbalized understanding and denied questions.   

## 2018-10-12 NOTE — Addendum Note (Signed)
Addended by: Valerie Roys on: 10/12/2018 05:11 PM   Modules accepted: Orders

## 2018-10-12 NOTE — Telephone Encounter (Signed)
Patient called and spoke to wife since he was not available. She says that she has the flu and was diagnosed today and her doctor told her to have everyone in the house call their providers to ask for preventive Tamiflu. I asked about symptoms, she says he does not have any symptoms. I advised I will send this over to [facility] and someone will call with her recommendation.  Reason for Disposition . [1]Influenza EXPOSURE  (Close Contact) within last 7 days AND [2] NO respiratory symptoms  Answer Assessment - Initial Assessment Questions 1. TYPE of EXPOSURE: "How were you exposed?" (e.g., close contact, not a close contact)     Close contact with wife 2. DATE of EXPOSURE: "When did the exposure occur?" (e.g., hour, days, weeks)     Diagnosed today; symptoms since yesterday afternoon 3. PREGNANCY: "Is there any chance you are pregnant?" "When was your last menstrual period?"     N/A 4. HIGH RISK for COMPLICATIONS: "Do you have any heart or lung problems? Do you have a weakened immune system?" (e.g., CHF, COPD, asthma, HIV positive, chemotherapy, renal failure, diabetes mellitus, sickle cell anemia)     No 5. SYMPTOMS: "Do you have any symptoms?" (e.g., cough, fever, sore throat, difficulty breathing).     No  Protocols used: INFLUENZA EXPOSURE-A-AH

## 2018-11-09 ENCOUNTER — Other Ambulatory Visit: Payer: Self-pay | Admitting: Family Medicine

## 2019-02-26 DIAGNOSIS — S0501XA Injury of conjunctiva and corneal abrasion without foreign body, right eye, initial encounter: Secondary | ICD-10-CM | POA: Diagnosis not present

## 2019-02-28 DIAGNOSIS — S0501XD Injury of conjunctiva and corneal abrasion without foreign body, right eye, subsequent encounter: Secondary | ICD-10-CM | POA: Diagnosis not present

## 2019-03-21 ENCOUNTER — Other Ambulatory Visit: Payer: Self-pay

## 2019-03-21 ENCOUNTER — Encounter: Payer: Self-pay | Admitting: Family Medicine

## 2019-03-21 ENCOUNTER — Ambulatory Visit (INDEPENDENT_AMBULATORY_CARE_PROVIDER_SITE_OTHER): Payer: BLUE CROSS/BLUE SHIELD | Admitting: Family Medicine

## 2019-03-21 ENCOUNTER — Telehealth: Payer: Self-pay | Admitting: Family Medicine

## 2019-03-21 VITALS — BP 126/78 | HR 74 | Temp 98.6°F | Ht 73.0 in | Wt 279.0 lb

## 2019-03-21 DIAGNOSIS — I10 Essential (primary) hypertension: Secondary | ICD-10-CM

## 2019-03-21 DIAGNOSIS — Z0000 Encounter for general adult medical examination without abnormal findings: Secondary | ICD-10-CM | POA: Diagnosis not present

## 2019-03-21 LAB — UA/M W/RFLX CULTURE, ROUTINE
Bilirubin, UA: NEGATIVE
Glucose, UA: NEGATIVE
Ketones, UA: NEGATIVE
Leukocytes,UA: NEGATIVE
Nitrite, UA: NEGATIVE
Protein,UA: NEGATIVE
RBC, UA: NEGATIVE
Specific Gravity, UA: 1.025 (ref 1.005–1.030)
Urobilinogen, Ur: 0.2 mg/dL (ref 0.2–1.0)
pH, UA: 5.5 (ref 5.0–7.5)

## 2019-03-21 NOTE — Telephone Encounter (Signed)
Called pt to go over script screening, no answer, left voicemail

## 2019-03-21 NOTE — Progress Notes (Signed)
BP 126/78   Pulse 74   Temp 98.6 F (37 C) (Oral)   Ht 6\' 1"  (1.854 m)   Wt 279 lb (126.6 kg)   SpO2 95%   BMI 36.81 kg/m    Subjective:    Patient ID: Micheal Khan, male    DOB: 03/26/1961, 58 y.o.   MRN: [id number]  HPI: Micheal Khan is a 58 y.o. male presenting on 03/21/2019 for comprehensive medical examination. Current medical complaints include:none  Home BPs 110-120/70s. Doing very well since addition of low dose lisinopril. Feels like he has more energy and his anxiety is less since starting. Denies Cp, SOB, HAs, dizziness, side effects.   He currently lives with: Interim Problems from his last visit: no  Depression Screen done today and results listed below:  Depression screen [facility] 2/9 03/21/2019 03/15/2018 03/14/2017 02/25/2016  Decreased Interest 0 0 0 0  Down, Depressed, Hopeless 0 0 0 0  PHQ - 2 Score 0 0 0 0  Altered sleeping 0 - - -  Tired, decreased energy 0 - - -  Change in appetite 0 - - -  Feeling bad or failure about yourself  0 - - -  Trouble concentrating 0 - - -  Moving slowly or fidgety/restless 0 - - -  Suicidal thoughts 0 - - -  PHQ-9 Score 0 - - -    The patient does not have a history of falls. I did not complete a risk assessment for falls. A plan of care for falls was not documented.   Past Medical History:  Past Medical History:  Diagnosis Date  . Cancer ([place])    testicular  . GERD (gastroesophageal reflux disease)     Surgical History:  History reviewed. No pertinent surgical history.  Medications:  Current Outpatient Medications on File Prior to Visit  Medication Sig  . cholecalciferol (VITAMIN D) 1000 units tablet Take 1,000 Units by mouth daily.  Marland Kitchen GARLIC OIL PO Take by mouth daily. 1500 mg  . lisinopril (PRINIVIL,ZESTRIL) 5 MG tablet Take 1 tablet (5 mg total) by mouth daily.  . Multiple Vitamin (ONE-A-DAY MENS PO) Take 1 tablet by mouth daily.  . Probiotic Product (PROBIOTIC DAILY PO) Take by mouth daily.  Marland Kitchen UNABLE TO FIND  Apple cider vinagre. Liquid. One table spoon a day   No current facility-administered medications on file prior to visit.     Allergies:  No Known Allergies  Social History:  Social History   Socioeconomic History  . Marital status: Married    Spouse name: Not on file  . Number of children: Not on file  . Years of education: Not on file  . Highest education level: Not on file  Occupational History  . Not on file  Social Needs  . Financial resource strain: Not on file  . Food insecurity    Worry: Not on file    Inability: Not on file  . Transportation needs    Medical: Not on file    Non-medical: Not on file  Tobacco Use  . Smoking status: Never Smoker  . Smokeless tobacco: Never Used  Substance and Sexual Activity  . Alcohol use: No    Alcohol/week: 0.0 standard drinks  . Drug use: No  . Sexual activity: Yes  Lifestyle  . Physical activity    Days per week: Not on file    Minutes per session: Not on file  . Stress: Not on file  Relationships  . Social connections  Talks on phone: Not on file    Gets together: Not on file    Attends religious service: Not on file    Active member of club or organization: Not on file    Attends meetings of clubs or organizations: Not on file    Relationship status: Not on file  . Intimate partner violence    Fear of current or ex partner: Not on file    Emotionally abused: Not on file    Physically abused: Not on file    Forced sexual activity: Not on file  Other Topics Concern  . Not on file  Social History Narrative  . Not on file   Social History   Tobacco Use  Smoking Status Never Smoker  Smokeless Tobacco Never Used   Social History   Substance and Sexual Activity  Alcohol Use No  . Alcohol/week: 0.0 standard drinks    Family History:  Family History  Problem Relation Age of Onset  . Cancer Mother        melanoma  . Heart disease Father        MI  . Cancer Maternal Grandfather   . Stroke Paternal  Grandmother   . Heart disease Paternal Grandfather     Past medical history, surgical history, medications, allergies, family history and social history reviewed with patient today and changes made to appropriate areas of the chart.   Review of Systems - General ROS: negative Psychological ROS: negative Ophthalmic ROS: negative ENT ROS: negative Allergy and Immunology ROS: negative Hematological and Lymphatic ROS: negative Endocrine ROS: negative Respiratory ROS: no cough, shortness of breath, or wheezing Cardiovascular ROS: no chest pain or dyspnea on exertion Gastrointestinal ROS: no abdominal pain, change in bowel habits, or black or bloody stools Genito-Urinary ROS: no dysuria, trouble voiding, or hematuria Musculoskeletal ROS: negative Neurological ROS: no TIA or stroke symptoms Dermatological ROS: negative All other ROS negative except what is listed above and in the HPI.      Objective:    BP 126/78   Pulse 74   Temp 98.6 F (37 C) (Oral)   Ht 6\' 1"  (1.854 m)   Wt 279 lb (126.6 kg)   SpO2 95%   BMI 36.81 kg/m   Wt Readings from Last 3 Encounters:  03/21/19 279 lb (126.6 kg)  08/17/18 263 lb (119.3 kg)  07/20/18 270 lb (122.5 kg)    Physical Exam Vitals signs and nursing note reviewed.  Constitutional:      General: He is not in acute distress.    Appearance: He is well-developed.  HENT:     Head: Atraumatic.     Right Ear: Tympanic membrane and external ear normal.     Left Ear: Tympanic membrane and external ear normal.     Nose: Nose normal.     Mouth/Throat:     Mouth: Mucous membranes are moist.     Pharynx: Oropharynx is clear.  Eyes:     General: No scleral icterus.    Conjunctiva/sclera: Conjunctivae normal.     Pupils: Pupils are equal, round, and reactive to light.  Neck:     Musculoskeletal: Normal range of motion and neck supple.  Cardiovascular:     Rate and Rhythm: Normal rate and regular rhythm.     Heart sounds: Normal heart sounds. No  murmur.  Pulmonary:     Effort: Pulmonary effort is normal. No respiratory distress.     Breath sounds: Normal breath sounds.  Abdominal:  General: Bowel sounds are normal. There is no distension.     Palpations: Abdomen is soft. There is no mass.     Tenderness: There is no abdominal tenderness. There is no guarding.  Genitourinary:    Comments: Exam declined with shared decision making Musculoskeletal: Normal range of motion.        General: No tenderness.  Skin:    General: Skin is warm and dry.     Findings: No rash.  Neurological:     General: No focal deficit present.     Mental Status: He is alert and oriented to person, place, and time.     Deep Tendon Reflexes: Reflexes are normal and symmetric.  Psychiatric:        Mood and Affect: Mood normal.        Behavior: Behavior normal.        Thought Content: Thought content normal.        Judgment: Judgment normal.     Results for orders placed or performed in visit on 03/21/19  CBC with Differential/Platelet  Result Value Ref Range   WBC 7.7 3.4 - 10.8 x10E3/uL   RBC 4.58 4.14 - 5.80 x10E6/uL   Hemoglobin 14.2 13.0 - 17.7 g/dL   Hematocrit 42.2 37.5 - 51.0 %   MCV 92 79 - 97 fL   MCH 31.0 26.6 - 33.0 pg   MCHC 33.6 31.5 - 35.7 g/dL   RDW 12.3 11.6 - 15.4 %   Platelets 214 150 - 450 x10E3/uL   Neutrophils 64 Not Estab. %   Lymphs 24 Not Estab. %   Monocytes 9 Not Estab. %   Eos 2 Not Estab. %   Basos 1 Not Estab. %   Neutrophils Absolute 5.0 1.4 - 7.0 x10E3/uL   Lymphocytes Absolute 1.8 0.7 - 3.1 x10E3/uL   Monocytes Absolute 0.7 0.1 - 0.9 x10E3/uL   EOS (ABSOLUTE) 0.2 0.0 - 0.4 x10E3/uL   Basophils Absolute 0.0 0.0 - 0.2 x10E3/uL   Immature Granulocytes 0 Not Estab. %   Immature Grans (Abs) 0.0 0.0 - 0.1 x10E3/uL  Comprehensive metabolic panel  Result Value Ref Range   Glucose 85 65 - 99 mg/dL   BUN 21 6 - 24 mg/dL   Creatinine, Ser 1.19 0.76 - 1.27 mg/dL   GFR calc non Af Amer 68 >59 mL/min/1.73   GFR  calc Af Amer 78 >59 mL/min/1.73   BUN/Creatinine Ratio 18 9 - 20   Sodium 142 134 - 144 mmol/L   Potassium 4.4 3.5 - 5.2 mmol/L   Chloride 106 96 - 106 mmol/L   CO2 19 (L) 20 - 29 mmol/L   Calcium 9.4 8.7 - 10.2 mg/dL   Total Protein 6.7 6.0 - 8.5 g/dL   Albumin 4.7 3.8 - 4.9 g/dL   Globulin, Total 2.0 1.5 - 4.5 g/dL   Albumin/Globulin Ratio 2.4 (H) 1.2 - 2.2   Bilirubin Total 0.3 0.0 - 1.2 mg/dL   Alkaline Phosphatase 57 39 - 117 IU/L   AST 24 0 - 40 IU/L   ALT 24 0 - 44 IU/L  Lipid Panel w/o Chol/HDL Ratio  Result Value Ref Range   Cholesterol, Total 202 (H) 100 - 199 mg/dL   Triglycerides 240 (H) 0 - 149 mg/dL   HDL 41 >39 mg/dL   VLDL Cholesterol Cal 48 (H) 5 - 40 mg/dL   LDL Calculated 113 (H) 0 - 99 mg/dL  UA/M w/rflx Culture, Routine   Specimen: Urine   URINE  Result Value Ref Range   Specific Gravity, UA 1.025 1.005 - 1.030   pH, UA 5.5 5.0 - 7.5   Color, UA Yellow Yellow   Appearance Ur Clear Clear   Leukocytes,UA Negative Negative   Protein,UA Negative Negative/Trace   Glucose, UA Negative Negative   Ketones, UA Negative Negative   RBC, UA Negative Negative   Bilirubin, UA Negative Negative   Urobilinogen, Ur 0.2 0.2 - 1.0 mg/dL   Nitrite, UA Negative Negative      Assessment & Plan:   Problem List Items Addressed This Visit      Cardiovascular and Mediastinum   Essential hypertension, benign - Primary    BPs stable and WNL, continue current regimen and good lifestyle modifications      Relevant Orders   CBC with Differential/Platelet (Completed)   Comprehensive metabolic panel (Completed)   UA/M w/rflx Culture, Routine (Completed)    Other Visit Diagnoses    Annual physical exam       Relevant Orders   Lipid Panel w/o Chol/HDL Ratio (Completed)       Discussed aspirin prophylaxis for myocardial infarction prevention and decision was it was not indicated  LABORATORY TESTING:  Health maintenance labs ordered today as discussed above.   The  natural history of prostate cancer and ongoing controversy regarding screening and potential treatment outcomes of prostate cancer has been discussed with the patient. The meaning of a false positive PSA and a false negative PSA has been discussed. He indicates understanding of the limitations of this screening test and wishes not to proceed with screening PSA testing.   IMMUNIZATIONS:   - Tdap: Tetanus vaccination status reviewed: last tetanus booster within 10 years. - Influenza: Postponed to flu season  SCREENING: - Colonoscopy: Up to date  Discussed with patient purpose of the colonoscopy is to detect colon cancer at curable precancerous or early stages   PATIENT COUNSELING:    Sexuality: Discussed sexually transmitted diseases, partner selection, use of condoms, avoidance of unintended pregnancy  and contraceptive alternatives.   Advised to avoid cigarette smoking.  I discussed with the patient that most people either abstain from alcohol or drink within safe limits (<=14/week and <=4 drinks/occasion for males, <=7/weeks and <= 3 drinks/occasion for females) and that the risk for alcohol disorders and other health effects rises proportionally with the number of drinks per week and how often a drinker exceeds daily limits.  Discussed cessation/primary prevention of drug use and availability of treatment for abuse.   Diet: Encouraged to adjust caloric intake to maintain  or achieve ideal body weight, to reduce intake of dietary saturated fat and total fat, to limit sodium intake by avoiding high sodium foods and not adding table salt, and to maintain adequate dietary potassium and calcium preferably from fresh fruits, vegetables, and low-fat dairy products.    stressed the importance of regular exercise  Injury prevention: Discussed safety belts, safety helmets, smoke detector, smoking near bedding or upholstery.   Dental health: Discussed importance of regular tooth brushing, flossing,  and dental visits.   Follow up plan: NEXT PREVENTATIVE PHYSICAL DUE IN 1 YEAR. Return in about 6 months (around 09/20/2019) for HTN.

## 2019-03-22 LAB — CBC WITH DIFFERENTIAL/PLATELET
Basophils Absolute: 0 10*3/uL (ref 0.0–0.2)
Basos: 1 %
EOS (ABSOLUTE): 0.2 10*3/uL (ref 0.0–0.4)
Eos: 2 %
Hematocrit: 42.2 % (ref 37.5–51.0)
Hemoglobin: 14.2 g/dL (ref 13.0–17.7)
Immature Grans (Abs): 0 10*3/uL (ref 0.0–0.1)
Immature Granulocytes: 0 %
Lymphocytes Absolute: 1.8 10*3/uL (ref 0.7–3.1)
Lymphs: 24 %
MCH: 31 pg (ref 26.6–33.0)
MCHC: 33.6 g/dL (ref 31.5–35.7)
MCV: 92 fL (ref 79–97)
Monocytes Absolute: 0.7 10*3/uL (ref 0.1–0.9)
Monocytes: 9 %
Neutrophils Absolute: 5 10*3/uL (ref 1.4–7.0)
Neutrophils: 64 %
Platelets: 214 10*3/uL (ref 150–450)
RBC: 4.58 x10E6/uL (ref 4.14–5.80)
RDW: 12.3 % (ref 11.6–15.4)
WBC: 7.7 10*3/uL (ref 3.4–10.8)

## 2019-03-22 LAB — COMPREHENSIVE METABOLIC PANEL
ALT: 24 IU/L (ref 0–44)
AST: 24 IU/L (ref 0–40)
Albumin/Globulin Ratio: 2.4 — ABNORMAL HIGH (ref 1.2–2.2)
Albumin: 4.7 g/dL (ref 3.8–4.9)
Alkaline Phosphatase: 57 IU/L (ref 39–117)
BUN/Creatinine Ratio: 18 (ref 9–20)
BUN: 21 mg/dL (ref 6–24)
Bilirubin Total: 0.3 mg/dL (ref 0.0–1.2)
CO2: 19 mmol/L — ABNORMAL LOW (ref 20–29)
Calcium: 9.4 mg/dL (ref 8.7–10.2)
Chloride: 106 mmol/L (ref 96–106)
Creatinine, Ser: 1.19 mg/dL (ref 0.76–1.27)
GFR calc Af Amer: 78 mL/min/{1.73_m2} (ref >59)
GFR calc non Af Amer: 68 mL/min/{1.73_m2} (ref >59)
Globulin, Total: 2 g/dL (ref 1.5–4.5)
Glucose: 85 mg/dL (ref 65–99)
Potassium: 4.4 mmol/L (ref 3.5–5.2)
Sodium: 142 mmol/L (ref 134–144)
Total Protein: 6.7 g/dL (ref 6.0–8.5)

## 2019-03-22 LAB — LIPID PANEL W/O CHOL/HDL RATIO
Cholesterol, Total: 202 mg/dL — ABNORMAL HIGH (ref 100–199)
HDL: 41 mg/dL (ref >39)
LDL Calculated: 113 mg/dL — ABNORMAL HIGH (ref 0–99)
Triglycerides: 240 mg/dL — ABNORMAL HIGH (ref 0–149)
VLDL Cholesterol Cal: 48 mg/dL — ABNORMAL HIGH (ref 5–40)

## 2019-03-26 NOTE — Assessment & Plan Note (Signed)
BPs stable and WNL, continue current regimen and good lifestyle modifications

## 2019-03-28 DIAGNOSIS — L570 Actinic keratosis: Secondary | ICD-10-CM | POA: Diagnosis not present

## 2019-03-28 DIAGNOSIS — L738 Other specified follicular disorders: Secondary | ICD-10-CM | POA: Diagnosis not present

## 2019-03-28 DIAGNOSIS — Z1283 Encounter for screening for malignant neoplasm of skin: Secondary | ICD-10-CM | POA: Diagnosis not present

## 2019-03-28 DIAGNOSIS — L578 Other skin changes due to chronic exposure to nonionizing radiation: Secondary | ICD-10-CM | POA: Diagnosis not present

## 2019-03-28 DIAGNOSIS — D485 Neoplasm of uncertain behavior of skin: Secondary | ICD-10-CM | POA: Diagnosis not present

## 2019-04-18 DIAGNOSIS — D225 Melanocytic nevi of trunk: Secondary | ICD-10-CM | POA: Diagnosis not present

## 2019-05-08 ENCOUNTER — Telehealth: Payer: Self-pay | Admitting: Family Medicine

## 2019-05-08 NOTE — Telephone Encounter (Signed)
Routing to provider  

## 2019-05-08 NOTE — Telephone Encounter (Signed)
Copied from [place] [id number]. Topic: Quick Communication - Rx Refill/Question >> May 08, 2019  1:59 PM Mcneil, Ja-Kwan wrote: Medication: lisinopril (PRINIVIL,ZESTRIL) 5 MG tablet  Has the patient contacted their pharmacy? no  Preferred Pharmacy (with phone number or street name): [facility], [phone] (Phone) [phone] (Fax)  Agent: Please be advised that RX refills may take up to 3 business days. We ask that you follow-up with your pharmacy.

## 2019-05-09 MED ORDER — LISINOPRIL 5 MG PO TABS: 5.0000 mg | ORAL_TABLET | Freq: Every day | ORAL | 1 refills | Status: DC

## 2019-05-09 NOTE — Telephone Encounter (Signed)
Rx sent 

## 2019-06-20 DIAGNOSIS — Z23 Encounter for immunization: Secondary | ICD-10-CM | POA: Diagnosis not present

## 2019-08-29 DIAGNOSIS — H524 Presbyopia: Secondary | ICD-10-CM | POA: Diagnosis not present

## 2019-08-30 ENCOUNTER — Other Ambulatory Visit: Payer: Self-pay

## 2019-08-31 ENCOUNTER — Other Ambulatory Visit: Payer: Self-pay

## 2019-08-31 ENCOUNTER — Ambulatory Visit (INDEPENDENT_AMBULATORY_CARE_PROVIDER_SITE_OTHER): Payer: BLUE CROSS/BLUE SHIELD | Admitting: Family Medicine

## 2019-08-31 ENCOUNTER — Encounter: Payer: Self-pay | Admitting: Family Medicine

## 2019-08-31 VITALS — BP 118/77 | HR 67 | Temp 98.3°F | Ht 74.0 in | Wt 277.0 lb

## 2019-08-31 DIAGNOSIS — K219 Gastro-esophageal reflux disease without esophagitis: Secondary | ICD-10-CM | POA: Diagnosis not present

## 2019-08-31 DIAGNOSIS — R002 Palpitations: Secondary | ICD-10-CM | POA: Diagnosis not present

## 2019-08-31 MED ORDER — PANTOPRAZOLE SODIUM 40 MG PO TBEC: 40.0000 mg | DELAYED_RELEASE_TABLET | Freq: Every day | ORAL | 3 refills | Status: DC

## 2019-08-31 MED ORDER — SUCRALFATE 1 G PO TABS: 1.0000 g | ORAL_TABLET | Freq: Three times a day (TID) | ORAL | 0 refills | Status: DC

## 2019-08-31 NOTE — Progress Notes (Signed)
BP 118/77   Pulse 67   Temp 98.3 F (36.8 C) (Oral)   Ht 6\' 2"  (1.88 m)   Wt 277 lb (125.6 kg)   SpO2 95%   BMI 35.56 kg/m    Subjective:    Patient ID: Micheal Khan, male    DOB: [birth year], 58 y.o.   MRN: [id number]  HPI: Micheal Khan is a 58 y.o. male  Chief Complaint  Patient presents with  . Gastroesophageal Reflux    x 5 weeks  . Palpitations    pt states it started at the same time with his stomch problems. off and on   About 5 weeks of feeling "off", epigastric pain, reflux sxs, belching. Tried mylanta, pepcid for several weeks which wasn't helping much. Now tried prilosec the past few days and has felt some better. Denies CP, SOB, diaphoresis, vomiting, diarrhea, fevers, melena.   Also having some more palpitations, hx of PVCs 10 years ago diagnosed after an event monitor and stress test. Often times after eating a meal or lying down will seem to trigger them. Feels these have been acting up since his stomach has been bothering him which historically seems to happen to him.   Relevant past medical, surgical, family and social history reviewed and updated as indicated. Interim medical history since our last visit reviewed. Allergies and medications reviewed and updated.  Review of Systems  Per HPI unless specifically indicated above     Objective:    BP 118/77   Pulse 67   Temp 98.3 F (36.8 C) (Oral)   Ht 6\' 2"  (1.88 m)   Wt 277 lb (125.6 kg)   SpO2 95%   BMI 35.56 kg/m   Wt Readings from Last 3 Encounters:  08/31/19 277 lb (125.6 kg)  03/21/19 279 lb (126.6 kg)  08/17/18 263 lb (119.3 kg)    Physical Exam Vitals signs and nursing note reviewed.  Constitutional:      Appearance: Normal appearance.  HENT:     Head: Atraumatic.  Eyes:     Extraocular Movements: Extraocular movements intact.     Conjunctiva/sclera: Conjunctivae normal.  Neck:     Musculoskeletal: Normal range of motion and neck supple.  Cardiovascular:     Rate and Rhythm:  Normal rate and regular rhythm.  Pulmonary:     Effort: Pulmonary effort is normal.     Breath sounds: Normal breath sounds.  Abdominal:     General: Bowel sounds are normal.     Palpations: Abdomen is soft.     Tenderness: There is abdominal tenderness (mild epigastric ttp). There is no guarding.  Musculoskeletal: Normal range of motion.  Skin:    General: Skin is warm and dry.  Neurological:     General: No focal deficit present.     Mental Status: He is oriented to person, place, and time.  Psychiatric:        Mood and Affect: Mood normal.        Thought Content: Thought content normal.        Judgment: Judgment normal.     Results for orders placed or performed in visit on 03/21/19  CBC with Differential/Platelet  Result Value Ref Range   WBC 7.7 3.4 - 10.8 x10E3/uL   RBC 4.58 4.14 - 5.80 x10E6/uL   Hemoglobin 14.2 13.0 - 17.7 g/dL   Hematocrit 42.2 37.5 - 51.0 %   MCV 92 79 - 97 fL   MCH 31.0 26.6 - 33.0 pg  MCHC 33.6 31.5 - 35.7 g/dL   RDW 12.3 11.6 - 15.4 %   Platelets 214 150 - 450 x10E3/uL   Neutrophils 64 Not Estab. %   Lymphs 24 Not Estab. %   Monocytes 9 Not Estab. %   Eos 2 Not Estab. %   Basos 1 Not Estab. %   Neutrophils Absolute 5.0 1.4 - 7.0 x10E3/uL   Lymphocytes Absolute 1.8 0.7 - 3.1 x10E3/uL   Monocytes Absolute 0.7 0.1 - 0.9 x10E3/uL   EOS (ABSOLUTE) 0.2 0.0 - 0.4 x10E3/uL   Basophils Absolute 0.0 0.0 - 0.2 x10E3/uL   Immature Granulocytes 0 Not Estab. %   Immature Grans (Abs) 0.0 0.0 - 0.1 x10E3/uL  Comprehensive metabolic panel  Result Value Ref Range   Glucose 85 65 - 99 mg/dL   BUN 21 6 - 24 mg/dL   Creatinine, Ser 1.19 0.76 - 1.27 mg/dL   GFR calc non Af Amer 68 >59 mL/min/1.73   GFR calc Af Amer 78 >59 mL/min/1.73   BUN/Creatinine Ratio 18 9 - 20   Sodium 142 134 - 144 mmol/L   Potassium 4.4 3.5 - 5.2 mmol/L   Chloride 106 96 - 106 mmol/L   CO2 19 (L) 20 - 29 mmol/L   Calcium 9.4 8.7 - 10.2 mg/dL   Total Protein 6.7 6.0 - 8.5 g/dL    Albumin 4.7 3.8 - 4.9 g/dL   Globulin, Total 2.0 1.5 - 4.5 g/dL   Albumin/Globulin Ratio 2.4 (H) 1.2 - 2.2   Bilirubin Total 0.3 0.0 - 1.2 mg/dL   Alkaline Phosphatase 57 39 - 117 IU/L   AST 24 0 - 40 IU/L   ALT 24 0 - 44 IU/L  Lipid Panel w/o Chol/HDL Ratio  Result Value Ref Range   Cholesterol, Total 202 (H) 100 - 199 mg/dL   Triglycerides 240 (H) 0 - 149 mg/dL   HDL 41 >39 mg/dL   VLDL Cholesterol Cal 48 (H) 5 - 40 mg/dL   LDL Calculated 113 (H) 0 - 99 mg/dL  UA/M w/rflx Culture, Routine   Specimen: Urine   URINE  Result Value Ref Range   Specific Gravity, UA 1.025 1.005 - 1.030   pH, UA 5.5 5.0 - 7.5   Color, UA Yellow Yellow   Appearance Ur Clear Clear   Leukocytes,UA Negative Negative   Protein,UA Negative Negative/Trace   Glucose, UA Negative Negative   Ketones, UA Negative Negative   RBC, UA Negative Negative   Bilirubin, UA Negative Negative   Urobilinogen, Ur 0.2 0.2 - 1.0 mg/dL   Nitrite, UA Negative Negative      Assessment & Plan:   Problem List Items Addressed This Visit      Digestive   GERD (gastroesophageal reflux disease) - Primary    Start protonix, can take TUMs or pepcid prn additionally. Carafate prn, BRAT diet, small meals. Will check for H pylori if not improving      Relevant Medications   OMEPRAZOLE PO   pantoprazole (PROTONIX) 40 MG tablet   sucralfate (CARAFATE) 1 g tablet    Other Visit Diagnoses    Palpitations       EKG without acute ST or T wave changes and without arrhythmias today. Monitor closely, refer back to Cardiology if worsening sxs    Relevant Orders   EKG 12-Lead (Completed)    25 minutes spent in direct care and counseling with patient today   Follow up plan: Return for as scheduled.

## 2019-09-10 NOTE — Assessment & Plan Note (Signed)
Start protonix, can take TUMs or pepcid prn additionally. Carafate prn, BRAT diet, small meals. Will check for H pylori if not improving

## 2019-09-19 ENCOUNTER — Other Ambulatory Visit: Payer: Self-pay

## 2019-09-20 ENCOUNTER — Encounter: Payer: Self-pay | Admitting: Family Medicine

## 2019-09-20 ENCOUNTER — Ambulatory Visit (INDEPENDENT_AMBULATORY_CARE_PROVIDER_SITE_OTHER): Payer: BLUE CROSS/BLUE SHIELD | Admitting: Family Medicine

## 2019-09-20 ENCOUNTER — Other Ambulatory Visit: Payer: Self-pay

## 2019-09-20 VITALS — BP 128/84 | HR 66 | Temp 98.6°F | Ht 74.0 in | Wt 281.0 lb

## 2019-09-20 DIAGNOSIS — E782 Mixed hyperlipidemia: Secondary | ICD-10-CM | POA: Diagnosis not present

## 2019-09-20 DIAGNOSIS — I10 Essential (primary) hypertension: Secondary | ICD-10-CM | POA: Diagnosis not present

## 2019-09-20 DIAGNOSIS — E785 Hyperlipidemia, unspecified: Secondary | ICD-10-CM | POA: Insufficient documentation

## 2019-09-20 DIAGNOSIS — K219 Gastro-esophageal reflux disease without esophagitis: Secondary | ICD-10-CM | POA: Diagnosis not present

## 2019-09-20 MED ORDER — LISINOPRIL 5 MG PO TABS: 5.0000 mg | ORAL_TABLET | Freq: Every day | ORAL | 1 refills | Status: DC

## 2019-09-20 NOTE — Progress Notes (Signed)
BP 128/84   Pulse 66   Temp 98.6 F (37 C) (Oral)   Ht 6\' 2"  (1.88 m)   Wt 281 lb (127.5 kg)   SpO2 94%   BMI 36.08 kg/m    Subjective:    Patient ID: Micheal Khan, male    DOB: [birth year], 58 y.o.   MRN: [id number]  HPI: Micheal Khan is a 58 y.o. male  Chief Complaint  Patient presents with  . Hypertension   Patient presenting today for 6 month f/u.   HTN - Home BPs have been 120s/80s consistently. Taking his medicine faithfully without side effects. Denies CP, SOB, HAs, dizziness. Exercising fairly often and trying to eat well.   HLD - diet controlled. Trying to eat well and stay active. Denies claudication, CP, SOB.   Feels his gastritis has improved significantly on the protonix and prn carafate, which in turn has nearly taken away his palpitations he was having. Denies N/V, dark stools, belching, esophageal burning.   Relevant past medical, surgical, family and social history reviewed and updated as indicated. Interim medical history since our last visit reviewed. Allergies and medications reviewed and updated.  Review of Systems  Per HPI unless specifically indicated above     Objective:    BP 128/84   Pulse 66   Temp 98.6 F (37 C) (Oral)   Ht 6\' 2"  (1.88 m)   Wt 281 lb (127.5 kg)   SpO2 94%   BMI 36.08 kg/m   Wt Readings from Last 3 Encounters:  09/20/19 281 lb (127.5 kg)  08/31/19 277 lb (125.6 kg)  03/21/19 279 lb (126.6 kg)    Physical Exam Vitals and nursing note reviewed.  Constitutional:      Appearance: Normal appearance.  HENT:     Head: Atraumatic.  Eyes:     Extraocular Movements: Extraocular movements intact.     Conjunctiva/sclera: Conjunctivae normal.  Cardiovascular:     Rate and Rhythm: Normal rate and regular rhythm.  Pulmonary:     Effort: Pulmonary effort is normal.     Breath sounds: Normal breath sounds.  Abdominal:     General: Bowel sounds are normal.     Palpations: Abdomen is soft.     Tenderness: There is no  abdominal tenderness. There is no guarding.  Musculoskeletal:        General: Normal range of motion.     Cervical back: Normal range of motion and neck supple.  Skin:    General: Skin is warm and dry.  Neurological:     General: No focal deficit present.     Mental Status: He is oriented to person, place, and time.  Psychiatric:        Mood and Affect: Mood normal.        Thought Content: Thought content normal.        Judgment: Judgment normal.     Results for orders placed or performed in visit on 09/20/19  Lipid Panel w/o Chol/HDL Ratio out  Result Value Ref Range   Cholesterol, Total 198 100 - 199 mg/dL   Triglycerides 156 (H) 0 - 149 mg/dL   HDL 45 >39 mg/dL   VLDL Cholesterol Cal 28 5 - 40 mg/dL   LDL Chol Calc (NIH) 125 (H) 0 - 99 mg/dL  Comprehensive metabolic panel  Result Value Ref Range   Glucose 95 65 - 99 mg/dL   BUN 16 6 - 24 mg/dL   Creatinine, Ser 1.15 0.76 - 1.27  mg/dL   GFR calc non Af Amer 71 >59 mL/min/1.73   GFR calc Af Amer 82 >59 mL/min/1.73   BUN/Creatinine Ratio 14 9 - 20   Sodium 142 134 - 144 mmol/L   Potassium 4.3 3.5 - 5.2 mmol/L   Chloride 105 96 - 106 mmol/L   CO2 23 20 - 29 mmol/L   Calcium 9.5 8.7 - 10.2 mg/dL   Total Protein 6.8 6.0 - 8.5 g/dL   Albumin 4.6 3.8 - 4.9 g/dL   Globulin, Total 2.2 1.5 - 4.5 g/dL   Albumin/Globulin Ratio 2.1 1.2 - 2.2   Bilirubin Total 0.3 0.0 - 1.2 mg/dL   Alkaline Phosphatase 71 39 - 117 IU/L   AST 22 0 - 40 IU/L   ALT 25 0 - 44 IU/L      Assessment & Plan:   Problem List Items Addressed This Visit      Cardiovascular and Mediastinum   Essential hypertension, benign - Primary    BPs stable and under good control, continue current regimen      Relevant Medications   lisinopril (ZESTRIL) 5 MG tablet   Other Relevant Orders   Comprehensive metabolic panel (Completed)     Digestive   GERD (gastroesophageal reflux disease)    Improved, continue protonix as needed.         Other    Hyperlipidemia    Diet controlled. Recheck lipids, adjust as needed. Continue lifestyle modifications      Relevant Medications   lisinopril (ZESTRIL) 5 MG tablet   Other Relevant Orders   Lipid Panel w/o Chol/HDL Ratio out (Completed)       Follow up plan: Return in about 6 months (around 03/20/2020) for CPE.

## 2019-09-21 LAB — COMPREHENSIVE METABOLIC PANEL
ALT: 25 IU/L (ref 0–44)
AST: 22 IU/L (ref 0–40)
Albumin/Globulin Ratio: 2.1 (ref 1.2–2.2)
Albumin: 4.6 g/dL (ref 3.8–4.9)
Alkaline Phosphatase: 71 IU/L (ref 39–117)
BUN/Creatinine Ratio: 14 (ref 9–20)
BUN: 16 mg/dL (ref 6–24)
Bilirubin Total: 0.3 mg/dL (ref 0.0–1.2)
CO2: 23 mmol/L (ref 20–29)
Calcium: 9.5 mg/dL (ref 8.7–10.2)
Chloride: 105 mmol/L (ref 96–106)
Creatinine, Ser: 1.15 mg/dL (ref 0.76–1.27)
GFR calc Af Amer: 82 mL/min/{1.73_m2} (ref >59)
GFR calc non Af Amer: 71 mL/min/{1.73_m2} (ref >59)
Globulin, Total: 2.2 g/dL (ref 1.5–4.5)
Glucose: 95 mg/dL (ref 65–99)
Potassium: 4.3 mmol/L (ref 3.5–5.2)
Sodium: 142 mmol/L (ref 134–144)
Total Protein: 6.8 g/dL (ref 6.0–8.5)

## 2019-09-21 LAB — LIPID PANEL W/O CHOL/HDL RATIO
Cholesterol, Total: 198 mg/dL (ref 100–199)
HDL: 45 mg/dL (ref >39)
LDL Chol Calc (NIH): 125 mg/dL — ABNORMAL HIGH (ref 0–99)
Triglycerides: 156 mg/dL — ABNORMAL HIGH (ref 0–149)
VLDL Cholesterol Cal: 28 mg/dL (ref 5–40)

## 2019-09-24 NOTE — Assessment & Plan Note (Signed)
Diet controlled. Recheck lipids, adjust as needed. Continue lifestyle modifications

## 2019-09-24 NOTE — Assessment & Plan Note (Signed)
Improved, continue protonix as needed.

## 2019-09-24 NOTE — Assessment & Plan Note (Signed)
BPs stable and under good control, continue current regimen 

## 2019-10-10 ENCOUNTER — Other Ambulatory Visit: Payer: Self-pay | Admitting: Internal Medicine

## 2019-11-07 ENCOUNTER — Encounter: Payer: Self-pay | Admitting: Family Medicine

## 2019-11-07 ENCOUNTER — Other Ambulatory Visit: Payer: Self-pay | Admitting: Family Medicine

## 2019-11-07 DIAGNOSIS — Z800 Family history of malignant neoplasm of digestive organs: Secondary | ICD-10-CM

## 2019-11-27 ENCOUNTER — Other Ambulatory Visit: Payer: Self-pay | Admitting: Family Medicine

## 2019-11-27 NOTE — Telephone Encounter (Signed)
Requested Prescriptions  Pending Prescriptions Disp Refills  . pantoprazole (PROTONIX) 40 MG tablet [Pharmacy Med Name: PANTOPRAZOLE SOD DR 40 MG TAB] 30 tablet 0    Sig: Take 1 tablet (40 mg total) by mouth daily.     Gastroenterology: Proton Pump Inhibitors Passed - 11/27/2019 11:15 AM      Passed - Valid encounter within last 12 months    Recent Outpatient Visits          2 months ago Essential hypertension, benign   [facility] Merrie Roof [place], [place]   2 months ago Gastroesophageal reflux disease, unspecified whether esophagitis present   [facility] Merrie Roof [place], [place]   8 months ago Essential hypertension, benign   [place], [place], [place]   1 year ago Essential hypertension, benign   [place], Lilia Argue, [place]   1 year ago Essential hypertension, benign   [place], Lilia Argue, [place]      Future Appointments            In 4 months [facility], Lilia Argue, [place], [place]

## 2019-12-17 ENCOUNTER — Other Ambulatory Visit: Payer: Self-pay

## 2019-12-17 ENCOUNTER — Ambulatory Visit (INDEPENDENT_AMBULATORY_CARE_PROVIDER_SITE_OTHER): Payer: BC Managed Care – PPO | Admitting: Family Medicine

## 2019-12-17 ENCOUNTER — Encounter: Payer: Self-pay | Admitting: Family Medicine

## 2019-12-17 VITALS — BP 122/76 | HR 70 | Temp 98.8°F | Ht 74.0 in | Wt 272.0 lb

## 2019-12-17 DIAGNOSIS — M278 Other specified diseases of jaws: Secondary | ICD-10-CM | POA: Diagnosis not present

## 2019-12-17 DIAGNOSIS — Z800 Family history of malignant neoplasm of digestive organs: Secondary | ICD-10-CM

## 2019-12-17 NOTE — Progress Notes (Signed)
BP 122/76   Pulse 70   Temp 98.8 F (37.1 C) (Oral)   Ht 6\' 2"  (1.88 m)   Wt 272 lb (123.4 kg)   SpO2 97%   BMI 34.92 kg/m    Subjective:    Patient ID: Micheal Khan, male    DOB: Jul 23, 1961, 59 y.o.   MRN: [id number]  HPI: Micheal Khan is a 59 y.o. male  Chief Complaint  Patient presents with  . Mass    right side face   Lump on right jaw line that's been there for years but back in January brother was diagnosed with cancer so got him worried about things. No pain, size change, drainage, redness.   Brother was diagnosed this year at age 60 with colon cancer. Last colonoscopy was 2013, which was normal at that time. He is having no new sxs but wondering if his screening recommendations have changed due to change in fhx.   Relevant past medical, surgical, family and social history reviewed and updated as indicated. Interim medical history since our last visit reviewed. Allergies and medications reviewed and updated.  Review of Systems  Per HPI unless specifically indicated above     Objective:    BP 122/76   Pulse 70   Temp 98.8 F (37.1 C) (Oral)   Ht 6\' 2"  (1.88 m)   Wt 272 lb (123.4 kg)   SpO2 97%   BMI 34.92 kg/m   Wt Readings from Last 3 Encounters:  12/17/19 272 lb (123.4 kg)  09/20/19 281 lb (127.5 kg)  08/31/19 277 lb (125.6 kg)    Physical Exam Vitals and nursing note reviewed.  Constitutional:      Appearance: Normal appearance.  HENT:     Head: Atraumatic.  Eyes:     Extraocular Movements: Extraocular movements intact.     Conjunctiva/sclera: Conjunctivae normal.  Cardiovascular:     Rate and Rhythm: Normal rate and regular rhythm.  Pulmonary:     Effort: Pulmonary effort is normal.     Breath sounds: Normal breath sounds.  Musculoskeletal:        General: Normal range of motion.     Cervical back: Normal range of motion and neck supple.     Comments: Smooth, firm mobile mass along right jawline  Skin:    General: Skin is warm and  dry.  Neurological:     General: No focal deficit present.     Mental Status: He is oriented to person, place, and time.  Psychiatric:        Mood and Affect: Mood normal.        Thought Content: Thought content normal.        Judgment: Judgment normal.     Results for orders placed or performed in visit on 09/20/19  Lipid Panel w/o Chol/HDL Ratio out  Result Value Ref Range   Cholesterol, Total 198 100 - 199 mg/dL   Triglycerides 156 (H) 0 - 149 mg/dL   HDL 45 >39 mg/dL   VLDL Cholesterol Cal 28 5 - 40 mg/dL   LDL Chol Calc (NIH) 125 (H) 0 - 99 mg/dL  Comprehensive metabolic panel  Result Value Ref Range   Glucose 95 65 - 99 mg/dL   BUN 16 6 - 24 mg/dL   Creatinine, Ser 1.15 0.76 - 1.27 mg/dL   GFR calc non Af Amer 71 >59 mL/min/1.73   GFR calc Af Amer 82 >59 mL/min/1.73   BUN/Creatinine Ratio 14 9 - 20  Sodium 142 134 - 144 mmol/L   Potassium 4.3 3.5 - 5.2 mmol/L   Chloride 105 96 - 106 mmol/L   CO2 23 20 - 29 mmol/L   Calcium 9.5 8.7 - 10.2 mg/dL   Total Protein 6.8 6.0 - 8.5 g/dL   Albumin 4.6 3.8 - 4.9 g/dL   Globulin, Total 2.2 1.5 - 4.5 g/dL   Albumin/Globulin Ratio 2.1 1.2 - 2.2   Bilirubin Total 0.3 0.0 - 1.2 mg/dL   Alkaline Phosphatase 71 39 - 117 IU/L   AST 22 0 - 40 IU/L   ALT 25 0 - 44 IU/L      Assessment & Plan:   Problem List Items Addressed This Visit    None    Visit Diagnoses    Mass of jaw    -  Primary   Reassurance given, suspect cyst. Pt requesting excision. Referral placed to Gen Surgery    Relevant Orders   Ambulatory referral to General Surgery   Family history of colon cancer       Referral placed back to GI as he's had a significant fhx change that will likely result in him benefiting from sooner repeat colonoscopy   Relevant Orders   Ambulatory referral to Gastroenterology       Follow up plan: Return for as scheduled.

## 2019-12-20 ENCOUNTER — Other Ambulatory Visit: Payer: Self-pay

## 2019-12-20 ENCOUNTER — Telehealth: Payer: Self-pay

## 2019-12-20 DIAGNOSIS — Z800 Family history of malignant neoplasm of digestive organs: Secondary | ICD-10-CM

## 2019-12-20 NOTE — Telephone Encounter (Signed)
Gastroenterology Pre-Procedure Review  Request Date: Tuesday 01/08/20 Requesting Physician: Dr. Vicente Males  PATIENT REVIEW QUESTIONS: The patient responded to the following health history questions as indicated:    1. Are you having any GI issues? no 2. Do you have a personal history of Polyps? no 3. Do you have a family history of Colon Cancer or Polyps? yes (brother colon cancer) 4. Diabetes Mellitus? no 5. Joint replacements in the past 12 months?no 6. Major health problems in the past 3 months?no major problems.  Facial cyst 7. Any artificial heart valves, MVP, or defibrillator?no    MEDICATIONS & ALLERGIES:    Patient reports the following regarding taking any anticoagulation/antiplatelet therapy:   Plavix, Coumadin, Eliquis, Xarelto, Lovenox, Pradaxa, Brilinta, or Effient? no Aspirin? no  Patient confirms/reports the following medications:  Current Outpatient Medications  Medication Sig Dispense Refill  . cholecalciferol (VITAMIN D) 1000 units tablet Take 1,000 Units by mouth daily.    . Cyanocobalamin (VITAMIN B 12 PO) Take by mouth daily.    Marland Kitchen GARLIC OIL PO Take by mouth daily. 1500 mg    . lisinopril (ZESTRIL) 5 MG tablet Take 1 tablet (5 mg total) by mouth daily. 90 tablet 1  . Multiple Vitamin (ONE-A-DAY MENS PO) Take 1 tablet by mouth daily.    . pantoprazole (PROTONIX) 40 MG tablet Take 1 tablet (40 mg total) by mouth daily. 90 tablet 0  . Probiotic Product (PROBIOTIC DAILY PO) Take by mouth daily.    Marland Kitchen UNABLE TO FIND Apple cider vinagre. Liquid. One table spoon a day     No current facility-administered medications for this visit.    Patient confirms/reports the following allergies:  No Known Allergies  No orders of the defined types were placed in this encounter.   AUTHORIZATION INFORMATION Primary Insurance: 1D#: Group #:  Secondary Insurance: 1D#: Group #:  SCHEDULE INFORMATION: Date: Tuesday 01/08/20 Time: Location:[facility]

## 2019-12-25 ENCOUNTER — Encounter: Payer: Self-pay | Admitting: Surgery

## 2019-12-25 ENCOUNTER — Ambulatory Visit (INDEPENDENT_AMBULATORY_CARE_PROVIDER_SITE_OTHER): Payer: BC Managed Care – PPO | Admitting: Surgery

## 2019-12-25 ENCOUNTER — Other Ambulatory Visit: Payer: Self-pay

## 2019-12-25 VITALS — BP 150/88 | HR 56 | Temp 98.2°F | Resp 14 | Ht 74.0 in | Wt 273.2 lb

## 2019-12-25 DIAGNOSIS — M278 Other specified diseases of jaws: Secondary | ICD-10-CM | POA: Diagnosis not present

## 2019-12-25 NOTE — Patient Instructions (Signed)
We will refer you to [facility] ENT for evaluation of this area.  They will contact you to make this appointment. If you do not hear from them by next week please call us.   Follow-up with our office as needed.  Please call and ask to speak with a nurse if you develop questions or concerns.

## 2019-12-25 NOTE — Progress Notes (Signed)
Patient ID: Micheal Khan, male   DOB: 1961-04-04, 59 y.o.   MRN: [id number]  Chief Complaint:  Right jawline "cyst."  History of Present Illness Micheal Khan is a 59 y.o. male with history of above for well over a year.  Denies pain, drainage, or prior procedures/intervention.  H/o prior dermal cysts elsewhere.  Denies f/c, n/v and any increase in size over the year. No prior tx with antibiotics, denies any known URI, changes in swallowing, sore throat, nasal or ear congestion.   Past Medical History Past Medical History:  Diagnosis Date  . Cancer ([place]) 07/2004   testicular-surgery and chemo  . GERD (gastroesophageal reflux disease)   . Hypertension       Past Surgical History:  Procedure Laterality Date  . TESTICLE REMOVAL Left 07/2004    No Known Allergies  Current Outpatient Medications  Medication Sig Dispense Refill  . cholecalciferol (VITAMIN D) 1000 units tablet Take 1,000 Units by mouth daily.    . Cyanocobalamin (VITAMIN B 12 PO) Take by mouth daily.    Marland Kitchen GARLIC OIL PO Take by mouth daily. 1500 mg    . lisinopril (ZESTRIL) 5 MG tablet Take 1 tablet (5 mg total) by mouth daily. 90 tablet 1  . Multiple Vitamin (ONE-A-DAY MENS PO) Take 1 tablet by mouth daily.    . pantoprazole (PROTONIX) 40 MG tablet Take 1 tablet (40 mg total) by mouth daily. 90 tablet 0  . Probiotic Product (PROBIOTIC DAILY PO) Take by mouth daily.    Marland Kitchen UNABLE TO FIND Apple cider vinagre. Liquid. One table spoon a day     No current facility-administered medications for this visit.    Family History Family History  Problem Relation Age of Onset  . Cancer Mother        melanoma  . Heart disease Father        MI  . Colon cancer Brother 60  . Cancer Maternal Grandfather   . Stroke Paternal Grandmother   . Heart disease Paternal Grandfather       Social History Social History   Tobacco Use  . Smoking status: Never Smoker  . Smokeless tobacco: Never Used  Substance Use Topics  .  Alcohol use: No    Alcohol/week: 0.0 standard drinks  . Drug use: No        Review of Systems  Constitutional: Negative.   HENT: Negative.   Eyes: Negative.   Respiratory: Negative.   Cardiovascular: Negative.   Gastrointestinal: Positive for heartburn. Negative for abdominal pain, blood in stool, diarrhea, nausea and vomiting.  Genitourinary: Negative.   Musculoskeletal: Negative.   Skin: Negative.   Neurological: Negative.   Endo/Heme/Allergies: Negative.       Physical Exam Blood pressure (!) 150/88, pulse (!) 56, temperature 98.2 F (36.8 C), resp. rate 14, height 6\' 2"  (1.88 m), weight 273 lb 3.2 oz (123.9 kg), SpO2 94 %. Last Weight  Most recent update: 12/25/2019 10:16 AM   Weight  123.9 kg (273 lb 3.2 oz)            CONSTITUTIONAL: Well developed, and nourished, appropriately responsive and aware without distress.   EYES: Sclera non-icteric.   EARS, NOSE, MOUTH AND THROAT: Mask worn. The oropharynx is clear, without obvious lesions or ulcers. Oral mucosa is pink and moist.  Tonsils present, and w/o discoloration or coating.  Dentition: present and good.   Hearing is intact to voice.  1+cm nodule immediately upon right jawline close to mandible, mobile,  discrete.  No appreciable dermal involvement.  Possible LN.  NECK: Trachea is midline, and there is no jugular venous distension.  LYMPH NODES:  Cervical lymph nodes in the neck are not enlarged. RESPIRATORY:  Lungs are clear, and breath sounds are equal bilaterally. Normal respiratory effort without pathologic use of accessory muscles. CARDIOVASCULAR: Heart is regular in rate and rhythm. MUSCULOSKELETAL:  Symmetrical muscle tone appreciated in all four extremities.    SKIN: Skin turgor is normal. No pathologic skin lesions appreciated.  NEUROLOGIC:  Motor and sensation appear grossly normal.  Cranial nerves are grossly without defect. PSYCH:  Alert and oriented to person, place and time. Affect is appropriate for  situation.  Data Reviewed I have personally reviewed what is currently available of the patient's imaging, recent labs and medical records.   Labs:  CBC Latest Ref Rng & Units 03/21/2019 06/01/2018 03/15/2018  WBC 3.4 - 10.8 x10E3/uL 7.7 4.8 6.9  Hemoglobin 13.0 - 17.7 g/dL 14.2 14.2 14.0  Hematocrit 37.5 - 51.0 % 42.2 42.5 40.8  Platelets 150 - 450 x10E3/uL 214 222 216   CMP Latest Ref Rng & Units 09/20/2019 03/21/2019 08/17/2018  Glucose 65 - 99 mg/dL 95 85 92  BUN 6 - 24 mg/dL 16 21 16   Creatinine 0.76 - 1.27 mg/dL 1.15 1.19 1.08  Sodium 134 - 144 mmol/L 142 142 140  Potassium 3.5 - 5.2 mmol/L 4.3 4.4 4.4  Chloride 96 - 106 mmol/L 105 106 103  CO2 20 - 29 mmol/L 23 19(L) 22  Calcium 8.7 - 10.2 mg/dL 9.5 9.4 9.6  Total Protein 6.0 - 8.5 g/dL 6.8 6.7 -  Total Bilirubin 0.0 - 1.2 mg/dL 0.3 0.3 -  Alkaline Phos 39 - 117 IU/L 71 57 -  AST 0 - 40 IU/L 22 24 -  ALT 0 - 44 IU/L 25 24 -      Imaging:  Within last 24 hrs: No results found.  Assessment    Right mandibular nodule, potentially neither dermal nor subcutaneous.  Possible isolated lymph node, lipoma or association with nearby submandibular gland. Patient Active Problem List   Diagnosis Date Noted  . Hyperlipidemia 09/20/2019  . Anxiety 07/23/2018  . Obesity (BMI 35.0-39.9 without comorbidity) 03/15/2018  . Abdominal pain 05/13/2017  . Testicular cancer ([place]) 05/23/2015  . GERD (gastroesophageal reflux disease) 05/23/2015  . Essential hypertension, benign 05/23/2015    Plan    Rather than pursue imaging or exploration, will defer to ENT for further evaluation.  Face-to-face time spent with the patient and accompanying care providers(if present) was 15 minutes, with more than 50% of the time spent counseling, educating, and coordinating care of the patient.      Ronny Bacon M.D., FACS 12/25/2019, 10:53 AM

## 2019-12-27 ENCOUNTER — Other Ambulatory Visit (HOSPITAL_COMMUNITY): Payer: Self-pay | Admitting: Otolaryngology

## 2019-12-27 ENCOUNTER — Other Ambulatory Visit: Payer: Self-pay | Admitting: Otolaryngology

## 2019-12-27 DIAGNOSIS — R220 Localized swelling, mass and lump, head: Secondary | ICD-10-CM

## 2020-01-03 ENCOUNTER — Other Ambulatory Visit: Payer: Self-pay

## 2020-01-03 ENCOUNTER — Ambulatory Visit
Admission: RE | Admit: 2020-01-03 | Discharge: 2020-01-03 | Disposition: A | Discharge status: Home / Self Care | Payer: BC Managed Care – PPO | Source: Ambulatory Visit | Attending: Otolaryngology | Admitting: Otolaryngology

## 2020-01-03 DIAGNOSIS — R220 Localized swelling, mass and lump, head: Secondary | ICD-10-CM | POA: Diagnosis not present

## 2020-01-03 DIAGNOSIS — R221 Localized swelling, mass and lump, neck: Secondary | ICD-10-CM | POA: Diagnosis not present

## 2020-01-04 ENCOUNTER — Other Ambulatory Visit
Admission: RE | Admit: 2020-01-04 | Discharge: 2020-01-04 | Disposition: A | Discharge status: Home / Self Care | Payer: BC Managed Care – PPO | Source: Ambulatory Visit | Attending: Gastroenterology | Admitting: Gastroenterology

## 2020-01-04 DIAGNOSIS — Z01812 Encounter for preprocedural laboratory examination: Secondary | ICD-10-CM | POA: Diagnosis not present

## 2020-01-04 DIAGNOSIS — Z20822 Contact with and (suspected) exposure to covid-19: Secondary | ICD-10-CM | POA: Diagnosis not present

## 2020-01-04 LAB — SARS CORONAVIRUS 2 (TAT 6-24 HRS): SARS Coronavirus 2: NEGATIVE

## 2020-01-07 ENCOUNTER — Other Ambulatory Visit: Payer: Self-pay | Admitting: Otolaryngology

## 2020-01-07 DIAGNOSIS — R220 Localized swelling, mass and lump, head: Secondary | ICD-10-CM

## 2020-01-07 DIAGNOSIS — R221 Localized swelling, mass and lump, neck: Secondary | ICD-10-CM

## 2020-01-08 ENCOUNTER — Encounter: Payer: Self-pay | Admitting: Gastroenterology

## 2020-01-08 ENCOUNTER — Other Ambulatory Visit: Payer: Self-pay

## 2020-01-08 ENCOUNTER — Ambulatory Visit
Admission: RE | Admit: 2020-01-08 | Discharge: 2020-01-08 | Disposition: A | Discharge status: Home / Self Care | Payer: BC Managed Care – PPO | Attending: Gastroenterology | Admitting: Gastroenterology

## 2020-01-08 ENCOUNTER — Encounter
Admission: RE | Disposition: A | Discharge status: Home / Self Care | Payer: Self-pay | Source: Home / Self Care | Attending: Gastroenterology

## 2020-01-08 ENCOUNTER — Ambulatory Visit: Payer: BC Managed Care – PPO | Admitting: Anesthesiology

## 2020-01-08 DIAGNOSIS — D120 Benign neoplasm of cecum: Secondary | ICD-10-CM | POA: Insufficient documentation

## 2020-01-08 DIAGNOSIS — Z1211 Encounter for screening for malignant neoplasm of colon: Secondary | ICD-10-CM | POA: Insufficient documentation

## 2020-01-08 DIAGNOSIS — Z8249 Family history of ischemic heart disease and other diseases of the circulatory system: Secondary | ICD-10-CM | POA: Diagnosis not present

## 2020-01-08 DIAGNOSIS — Z8371 Family history of colonic polyps: Secondary | ICD-10-CM | POA: Diagnosis not present

## 2020-01-08 DIAGNOSIS — D125 Benign neoplasm of sigmoid colon: Secondary | ICD-10-CM | POA: Diagnosis not present

## 2020-01-08 DIAGNOSIS — K219 Gastro-esophageal reflux disease without esophagitis: Secondary | ICD-10-CM | POA: Insufficient documentation

## 2020-01-08 DIAGNOSIS — Z823 Family history of stroke: Secondary | ICD-10-CM | POA: Diagnosis not present

## 2020-01-08 DIAGNOSIS — K621 Rectal polyp: Secondary | ICD-10-CM | POA: Diagnosis not present

## 2020-01-08 DIAGNOSIS — Z79899 Other long term (current) drug therapy: Secondary | ICD-10-CM | POA: Insufficient documentation

## 2020-01-08 DIAGNOSIS — Z9221 Personal history of antineoplastic chemotherapy: Secondary | ICD-10-CM | POA: Insufficient documentation

## 2020-01-08 DIAGNOSIS — Z9079 Acquired absence of other genital organ(s): Secondary | ICD-10-CM | POA: Diagnosis not present

## 2020-01-08 DIAGNOSIS — Z809 Family history of malignant neoplasm, unspecified: Secondary | ICD-10-CM | POA: Insufficient documentation

## 2020-01-08 DIAGNOSIS — Z800 Family history of malignant neoplasm of digestive organs: Secondary | ICD-10-CM | POA: Diagnosis not present

## 2020-01-08 DIAGNOSIS — D123 Benign neoplasm of transverse colon: Secondary | ICD-10-CM | POA: Insufficient documentation

## 2020-01-08 DIAGNOSIS — D126 Benign neoplasm of colon, unspecified: Secondary | ICD-10-CM | POA: Diagnosis not present

## 2020-01-08 DIAGNOSIS — Z8547 Personal history of malignant neoplasm of testis: Secondary | ICD-10-CM | POA: Diagnosis not present

## 2020-01-08 DIAGNOSIS — K635 Polyp of colon: Secondary | ICD-10-CM

## 2020-01-08 DIAGNOSIS — D122 Benign neoplasm of ascending colon: Secondary | ICD-10-CM | POA: Diagnosis not present

## 2020-01-08 DIAGNOSIS — I10 Essential (primary) hypertension: Secondary | ICD-10-CM | POA: Insufficient documentation

## 2020-01-08 HISTORY — PX: COLONOSCOPY WITH PROPOFOL: SHX5780

## 2020-01-08 SURGERY — COLONOSCOPY WITH PROPOFOL
Anesthesia: General

## 2020-01-08 MED ORDER — SODIUM CHLORIDE 0.9 % IV SOLN
INTRAVENOUS | Status: DC
  Administered 2020-01-08: 1000 mL via INTRAVENOUS

## 2020-01-08 MED ORDER — LIDOCAINE HCL (PF) 2 % IJ SOLN
INTRAMUSCULAR | Status: AC
  Filled 2020-01-08: qty 5

## 2020-01-08 MED ORDER — PROPOFOL 10 MG/ML IV BOLUS
INTRAVENOUS | Status: DC | PRN
  Administered 2020-01-08 (×2): 30 mg via INTRAVENOUS
  Administered 2020-01-08: 70 mg via INTRAVENOUS

## 2020-01-08 MED ORDER — PROPOFOL 500 MG/50ML IV EMUL
INTRAVENOUS | Status: AC
  Filled 2020-01-08: qty 50

## 2020-01-08 MED ORDER — PROPOFOL 10 MG/ML IV BOLUS
INTRAVENOUS | Status: AC
  Filled 2020-01-08: qty 20

## 2020-01-08 MED ORDER — LIDOCAINE HCL (CARDIAC) PF 100 MG/5ML IV SOSY
PREFILLED_SYRINGE | INTRAVENOUS | Status: DC | PRN
  Administered 2020-01-08: 50 mg via INTRAVENOUS

## 2020-01-08 MED ORDER — PROPOFOL 500 MG/50ML IV EMUL
INTRAVENOUS | Status: DC | PRN
  Administered 2020-01-08: 175 ug/kg/min via INTRAVENOUS

## 2020-01-08 NOTE — Anesthesia Preprocedure Evaluation (Addendum)
Anesthesia Evaluation  Patient identified by MRN, date of birth, ID band Patient awake    Reviewed: Allergy & Precautions, H&P , NPO status , Patient's Chart, lab work & pertinent test results  Airway Mallampati: III  TM Distance: >3 FB Neck ROM: full    Dental  (+) Teeth Intact   Pulmonary neg pulmonary ROS, neg shortness of breath, neg sleep apnea, neg COPD,    breath sounds clear to auscultation       Cardiovascular hypertension, (-) angina(-) Past MI and (-) Cardiac Stents (-) dysrhythmias  Rhythm:regular Rate:Normal     Neuro/Psych Anxiety negative neurological ROS     GI/Hepatic Neg liver ROS, GERD  ,  Endo/Other  negative endocrine ROS  Renal/GU negative Renal ROS  negative genitourinary   Musculoskeletal   Abdominal   Peds  Hematology negative hematology ROS (+)   Anesthesia Other Findings Past Medical History: 07/2004: Cancer ([place])     Comment:  testicular-surgery and chemo No date: GERD (gastroesophageal reflux disease) No date: Hypertension  Past Surgical History: 07/2004: TESTICLE REMOVAL; Left     Reproductive/Obstetrics negative OB ROS                           Anesthesia Physical Anesthesia Plan  ASA: II  Anesthesia Plan: General   Post-op Pain Management:    Induction:   PONV Risk Score and Plan: Propofol infusion and TIVA  Airway Management Planned: Natural Airway and Nasal Cannula  Additional Equipment:   Intra-op Plan:   Post-operative Plan:   Informed Consent: I have reviewed the patients History and Physical, chart, labs and discussed the procedure including the risks, benefits and alternatives for the proposed anesthesia with the patient or authorized representative who has indicated his/her understanding and acceptance.     Dental Advisory Given  Plan Discussed with: Anesthesiologist  Anesthesia Plan Comments:         Anesthesia Quick  Evaluation

## 2020-01-08 NOTE — Op Note (Signed)
[facility] Gastroenterology Patient Name: Micheal Khan Procedure Date: 01/08/2020 11:28 AM MRN: [id number] Account #: [id number] Date of Birth: November 24, 1960 Admit Type: Outpatient Age: 59 Room: [facility] ENDO ROOM 1 Gender: Male Note Status: Finalized Procedure:             Colonoscopy Indications:           Screening in patient at increased risk: Family history                         of 1st-degree relative with colorectal cancer Providers:             Jonathon Bellows MD, MD Referring MD:          Volney American (Referring MD) Medicines:             Monitored Anesthesia Care Complications:         No immediate complications. Procedure:             Pre-Anesthesia Assessment:                        - Prior to the procedure, a History and Physical was                         performed, and patient medications, allergies and                         sensitivities were reviewed. The patient's tolerance                         of previous anesthesia was reviewed.                        - The risks and benefits of the procedure and the                         sedation options and risks were discussed with the                         patient. All questions were answered and informed                         consent was obtained.                        - ASA Grade Assessment: II - A patient with mild                         systemic disease.                        After obtaining informed consent, the colonoscope was                         passed under direct vision. Throughout the procedure,                         the patient's blood pressure, pulse, and oxygen                         saturations were monitored  continuously. The                         Colonoscope was introduced through the anus and                         advanced to the the cecum, identified by the                         appendiceal orifice. The colonoscopy was performed                         with  ease. The patient tolerated the procedure well.                         The quality of the bowel preparation was good. Findings:      The perianal and digital rectal examinations were normal.      A 10 mm polyp was found in the rectum. The polyp was sessile. The polyp       was removed with a hot snare. Resection and retrieval were complete.      Two sessile polyps were found in the sigmoid colon. The polyps were 4 to       6 mm in size. These polyps were removed with a cold snare. Resection and       retrieval were complete.      Two sessile polyps were found in the transverse colon. The polyps were 4       to 6 mm in size. These polyps were removed with a cold snare. Resection       and retrieval were complete.      Two sessile polyps were found in the ascending colon. The polyps were 4       to 6 mm in size. These polyps were removed with a cold snare. Resection       and retrieval were complete.      Two sessile polyps were found in the cecum. The polyps were 5 to 7 mm in       size. These polyps were removed with a cold snare. Resection and       retrieval were complete.      The exam was otherwise without abnormality on direct and retroflexion       views. Impression:            - One 10 mm polyp in the rectum, removed with a hot                         snare. Resected and retrieved.                        - Two 4 to 6 mm polyps in the sigmoid colon, removed                         with a cold snare. Resected and retrieved.                        - Two 4 to 6 mm polyps in the transverse colon,  removed with a cold snare. Resected and retrieved.                        - Two 4 to 6 mm polyps in the ascending colon, removed                         with a cold snare. Resected and retrieved.                        - Two 5 to 7 mm polyps in the cecum, removed with a                         cold snare. Resected and retrieved.                        - The  examination was otherwise normal on direct and                         retroflexion views. Recommendation:        - Discharge patient to home (with escort).                        - Resume previous diet.                        - Continue present medications.                        - Await pathology results.                        - Repeat colonoscopy in 3 years for surveillance. Procedure Code(s):     --- Professional ---                        [id number], Colonoscopy, flexible; with removal of                         tumor(s), polyp(s), or other lesion(s) by snare                         technique Diagnosis Code(s):     --- Professional ---                        Z80.0, Family history of malignant neoplasm of                         digestive organs                        K62.1, Rectal polyp                        K63.5, Polyp of colon CPT copyright 2019 American Medical Association. All rights reserved. The codes documented in this report are preliminary and upon coder review may  be revised to meet current compliance requirements. Jonathon Bellows, MD Jonathon Bellows MD, MD 01/08/2020 12:02:02 PM This report has been signed electronically. Number of Addenda: 0 Note Initiated On: 01/08/2020 11:28 AM Scope Withdrawal Time: 0 hours 21 minutes  39 seconds  Total Procedure Duration: 0 hours 25 minutes 20 seconds  Estimated Blood Loss:  Estimated blood loss: none.      [facility]

## 2020-01-08 NOTE — Anesthesia Procedure Notes (Signed)
Date/Time: 01/08/2020 11:29 AM Performed by: Johnna Acosta, CRNA Pre-anesthesia Checklist: Patient identified, Emergency Drugs available, Suction available, Patient being monitored and Timeout performed Patient Re-evaluated:Patient Re-evaluated prior to induction Oxygen Delivery Method: Nasal cannula Preoxygenation: Pre-oxygenation with 100% oxygen Induction Type: IV induction

## 2020-01-08 NOTE — Transfer of Care (Signed)
Immediate Anesthesia Transfer of Care Note  Patient: [name]  Procedure(s) Performed: COLONOSCOPY WITH PROPOFOL (N/A )  Patient Location: PACU  Anesthesia Type:General  Level of Consciousness: sedated  Airway & Oxygen Therapy: Patient Spontanous Breathing and Patient connected to nasal cannula oxygen  Post-op Assessment: Report given to RN and Post -op Vital signs reviewed and stable  Post vital signs: Reviewed and stable  Last Vitals:  Vitals Value Taken Time  BP 99/63 01/08/20 1205  Temp    Pulse 56 01/08/20 1205  Resp 13 01/08/20 1205  SpO2 98 % 01/08/20 1205    Last Pain:  Vitals:   01/08/20 1203  TempSrc: (P) Temporal  PainSc:          Complications: No apparent anesthesia complications

## 2020-01-08 NOTE — H&P (Signed)
Jonathon Bellows, MD [address] [place], [place], [place], [address] [id number], [place], [place], [place], [address] Phone: [phone]  Fax: [phone]  Primary Care Physician:  Volney American, PA-C   Pre-Procedure History & Physical: HPI:  Micheal Khan is a 59 y.o. male is here for an colonoscopy.   Past Medical History:  Diagnosis Date  . Cancer ([place]) 07/2004   testicular-surgery and chemo  . GERD (gastroesophageal reflux disease)   . Hypertension     Past Surgical History:  Procedure Laterality Date  . COLONOSCOPY WITH PROPOFOL    . ESOPHAGOGASTRODUODENOSCOPY (EGD) WITH PROPOFOL    . TESTICLE REMOVAL Left 07/2004    Prior to Admission medications   Medication Sig Start Date End Date Taking? Authorizing Provider  lisinopril (ZESTRIL) 5 MG tablet Take 1 tablet (5 mg total) by mouth daily. 09/20/19  Yes Volney American, PA-C  pantoprazole (PROTONIX) 40 MG tablet Take 1 tablet (40 mg total) by mouth daily. 11/27/19  Yes Volney American, PA-C  cholecalciferol (VITAMIN D) 1000 units tablet Take 1,000 Units by mouth daily.    [provider]  Cyanocobalamin (VITAMIN B 12 PO) Take by mouth daily.    [provider]  GARLIC OIL PO Take by mouth daily. 1500 mg    [provider]  Multiple Vitamin (ONE-A-DAY MENS PO) Take 1 tablet by mouth daily.    [provider]  Probiotic Product (PROBIOTIC DAILY PO) Take by mouth daily.    [provider]  UNABLE TO FIND Apple cider vinagre. Liquid. One table spoon a day    [provider]    Allergies as of 12/20/2019  . (No Known Allergies)    Family History  Problem Relation Age of Onset  . Cancer Mother        melanoma  . Heart disease Father        MI  . Colon cancer Brother 6  . Cancer Maternal Grandfather   . Stroke Paternal Grandmother   . Heart disease Paternal Grandfather     Social History   Socioeconomic History  . Marital  status: Married    Spouse name: Not on file  . Number of children: Not on file  . Years of education: Not on file  . Highest education level: Not on file  Occupational History  . Not on file  Tobacco Use  . Smoking status: Never Smoker  . Smokeless tobacco: Never Used  Substance and Sexual Activity  . Alcohol use: No    Alcohol/week: 0.0 standard drinks  . Drug use: No  . Sexual activity: Yes  Other Topics Concern  . Not on file  Social History Narrative  . Not on file   Social Determinants of Health   Financial Resource Strain:   . Difficulty of Paying Living Expenses:   Food Insecurity:   . Worried About Charity fundraiser in the Last Year:   . Arboriculturist in the Last Year:   Transportation Needs:   . Film/video editor (Medical):   Marland Kitchen Lack of Transportation (Non-Medical):   Physical Activity:   . Days of Exercise per Week:   . Minutes of Exercise per Session:   Stress:   . Feeling of Stress :   Social Connections:   . Frequency of Communication with Friends and Family:   . Frequency of Social Gatherings with Friends and Family:   . Attends Religious Services:   .  Active Member of Clubs or Organizations:   . Attends Archivist Meetings:   Marland Kitchen Marital Status:   Intimate Partner Violence:   . Fear of Current or Ex-Partner:   . Emotionally Abused:   Marland Kitchen Physically Abused:   . Sexually Abused:     Review of Systems: See HPI, otherwise negative ROS  Physical Exam: BP (!) 124/93   Pulse 66   Temp 97.6 F (36.4 C) (Tympanic)   Resp 18   Ht 6\' 2"  (1.88 m)   Wt 117.5 kg   SpO2 97%   BMI 33.25 kg/m  General:   Alert,  pleasant and cooperative in NAD Head:  Normocephalic and atraumatic. Neck:  Supple; no masses or thyromegaly. Lungs:  Clear throughout to auscultation, normal respiratory effort.    Heart:  +S1, +S2, Regular rate and rhythm, No edema. Abdomen:  Soft, nontender and nondistended. Normal bowel sounds, without guarding, and without  rebound.   Neurologic:  Alert and  oriented x4;  grossly normal neurologically.  Impression/Plan: Micheal Khan is here for an colonoscopy to be performed for Screening colonoscopy brother had colon cancer Risks, benefits, limitations, and alternatives regarding  colonoscopy have been reviewed with the patient.  Questions have been answered.  All parties agreeable.   Jonathon Bellows, MD  01/08/2020, 11:21 AM]

## 2020-01-08 NOTE — Anesthesia Postprocedure Evaluation (Signed)
Anesthesia Post Note  Patient: Micheal Khan  Procedure(s) Performed: COLONOSCOPY WITH PROPOFOL (N/A )  Patient location during evaluation: PACU Anesthesia Type: General Level of consciousness: awake and alert Pain management: pain level controlled Vital Signs Assessment: post-procedure vital signs reviewed and stable Respiratory status: spontaneous breathing, nonlabored ventilation and respiratory function stable Cardiovascular status: blood pressure returned to baseline and stable Postop Assessment: no apparent nausea or vomiting Anesthetic complications: no     Last Vitals:  Vitals:   01/08/20 1213 01/08/20 1223  BP: 106/72 118/80  Pulse: (!) 58 (!) 53  Resp: 12 14  Temp:    SpO2: 98% 98%    Last Pain:  Vitals:   01/08/20 1223  TempSrc:   PainSc: 0-No pain                 Tera Mater

## 2020-01-09 ENCOUNTER — Encounter: Payer: Self-pay | Admitting: *Deleted

## 2020-01-09 LAB — SURGICAL PATHOLOGY

## 2020-01-10 ENCOUNTER — Encounter: Payer: Self-pay | Admitting: Gastroenterology

## 2020-01-14 ENCOUNTER — Other Ambulatory Visit: Payer: Self-pay

## 2020-01-14 ENCOUNTER — Ambulatory Visit
Admission: RE | Admit: 2020-01-14 | Discharge: 2020-01-14 | Disposition: A | Discharge status: Home / Self Care | Payer: BC Managed Care – PPO | Source: Ambulatory Visit | Attending: Otolaryngology | Admitting: Otolaryngology

## 2020-01-14 ENCOUNTER — Telehealth: Payer: Self-pay

## 2020-01-14 DIAGNOSIS — Z8547 Personal history of malignant neoplasm of testis: Secondary | ICD-10-CM | POA: Diagnosis not present

## 2020-01-14 DIAGNOSIS — Z79899 Other long term (current) drug therapy: Secondary | ICD-10-CM | POA: Diagnosis not present

## 2020-01-14 DIAGNOSIS — K219 Gastro-esophageal reflux disease without esophagitis: Secondary | ICD-10-CM | POA: Diagnosis not present

## 2020-01-14 DIAGNOSIS — R221 Localized swelling, mass and lump, neck: Secondary | ICD-10-CM | POA: Diagnosis not present

## 2020-01-14 DIAGNOSIS — Z8249 Family history of ischemic heart disease and other diseases of the circulatory system: Secondary | ICD-10-CM | POA: Insufficient documentation

## 2020-01-14 DIAGNOSIS — I10 Essential (primary) hypertension: Secondary | ICD-10-CM | POA: Diagnosis not present

## 2020-01-14 DIAGNOSIS — R220 Localized swelling, mass and lump, head: Secondary | ICD-10-CM

## 2020-01-14 DIAGNOSIS — Z809 Family history of malignant neoplasm, unspecified: Secondary | ICD-10-CM | POA: Insufficient documentation

## 2020-01-14 NOTE — Procedures (Signed)
Pre Procedure Dx: Right submandibular nodule Post Procedural Dx: Same  Technically successful US guided biopsy of indeterminate right submandibular nodule.  EBL: None No immediate complications.   Ronny Bacon, MD Pager #: [phone]

## 2020-01-14 NOTE — H&P (Signed)
Chief Complaint: Patient was seen in consultation today for right submandibular lymph node biopsy.  Referring Physician(s): Vaught,Creighton  Supervising Physician: Sandi Mariscal  Patient Status: [facility] - Out-pt  History of Present Illness: Micheal Khan is a 59 y.o. male with a past medical history significant for GERD, HTN and testicular cancer s/p left orchiectomy and radiation who presents today for a submandibular lymph node biopsy. Micheal Khan presented to this PCP on 12/17/19 with complaints of a jaw lump that had been present for many years, however his brother had recently been diagnosed with cancer causing him to become concerned about the lump. After examination this was felt to be a cyst by his PCP and the patient requested excision. He was referred to general surgery for excision and was seen by Dr. Christian Mate on 3/16 where a 1+ cm mobile nodule was felt immediately to the right of the jawline which was felt to possibly be a lymph node. No excision was performed and he was referred to ENT for further evaluation. He underwent a US soft tissue head/neck on 3/25 which noted a 1.2 x 0.7 x 1.8 cm hypoechoic mass in the right submandibular region thought to likely be an enlarged lymph node. IR has been consulted for a biopsy of this area to further guide management.   Micheal Khan states he has been feeling well, denies any complaints today. He tells me that the spot has been present for several years and has not changed much in size, if anything he feels it may be smaller than previously. The swelling is non tender and he has no other areas of swelling that he is aware of. He denies any fevers, chills, night sweats, trouble swallowing or change in appetite. He states understanding of the requested procedure and wishes to proceed.  Past Medical History:  Diagnosis Date  . Cancer ([place]) 07/2004   testicular-surgery and chemo  . GERD (gastroesophageal reflux disease)   . Hypertension     Past  Surgical History:  Procedure Laterality Date  . COLONOSCOPY WITH PROPOFOL    . COLONOSCOPY WITH PROPOFOL N/A 01/08/2020   Procedure: COLONOSCOPY WITH PROPOFOL;  Surgeon: Jonathon Bellows, MD;  Location: [facility] ENDOSCOPY;  Service: Gastroenterology;  Laterality: N/A;  . ESOPHAGOGASTRODUODENOSCOPY (EGD) WITH PROPOFOL    . TESTICLE REMOVAL Left 07/2004    Allergies: Patient has no known allergies.  Medications: Prior to Admission medications   Medication Sig Start Date End Date Taking? Authorizing Provider  cholecalciferol (VITAMIN D) 1000 units tablet Take 1,000 Units by mouth daily.    [provider]  Cyanocobalamin (VITAMIN B 12 PO) Take by mouth daily.    [provider]  GARLIC OIL PO Take by mouth daily. 1500 mg    [provider]  lisinopril (ZESTRIL) 5 MG tablet Take 1 tablet (5 mg total) by mouth daily. 09/20/19   Volney American, PA-C  Multiple Vitamin (ONE-A-DAY MENS PO) Take 1 tablet by mouth daily.    [provider]  pantoprazole (PROTONIX) 40 MG tablet Take 1 tablet (40 mg total) by mouth daily. 11/27/19   Volney American, PA-C  Probiotic Product (PROBIOTIC DAILY PO) Take by mouth daily.    [provider]  UNABLE TO FIND Apple cider vinagre. Liquid. One table spoon a day    [provider]     Family History  Problem Relation Age of Onset  . Cancer Mother        melanoma  . Heart disease Father  MI  . Colon cancer Brother 2  . Cancer Maternal Grandfather   . Stroke Paternal Grandmother   . Heart disease Paternal Grandfather     Social History   Socioeconomic History  . Marital status: Married    Spouse name: Not on file  . Number of children: Not on file  . Years of education: Not on file  . Highest education level: Not on file  Occupational History  . Not on file  Tobacco Use  . Smoking status: Never Smoker  . Smokeless tobacco: Never Used  Substance and Sexual Activity  . Alcohol use:  No    Alcohol/week: 0.0 standard drinks  . Drug use: No  . Sexual activity: Yes  Other Topics Concern  . Not on file  Social History Narrative  . Not on file   Social Determinants of Health   Financial Resource Strain:   . Difficulty of Paying Living Expenses:   Food Insecurity:   . Worried About Charity fundraiser in the Last Year:   . Arboriculturist in the Last Year:   Transportation Needs:   . Film/video editor (Medical):   Marland Kitchen Lack of Transportation (Non-Medical):   Physical Activity:   . Days of Exercise per Week:   . Minutes of Exercise per Session:   Stress:   . Feeling of Stress :   Social Connections:   . Frequency of Communication with Friends and Family:   . Frequency of Social Gatherings with Friends and Family:   . Attends Religious Services:   . Active Member of Clubs or Organizations:   . Attends Archivist Meetings:   Marland Kitchen Marital Status:      Review of Systems: A 12 point ROS discussed and pertinent positives are indicated in the HPI above.  All other systems are negative.  Review of Systems  Constitutional: Negative for appetite change, chills, diaphoresis, fatigue and fever.  Respiratory: Negative for cough and shortness of breath.   Cardiovascular: Negative for chest pain.  Gastrointestinal: Negative for abdominal pain, diarrhea, nausea and vomiting.  Musculoskeletal: Negative for back pain.  Neurological: Negative for dizziness and headaches.  Hematological: Negative for adenopathy.    Vital Signs: BP 131/84   Pulse (!) 59   Temp 97.8 F (36.6 C) (Oral)   Resp 12   Ht 6\' 2"  (1.88 m)   Wt 260 lb (117.9 kg)   SpO2 97%   BMI 33.38 kg/m   Physical Exam Vitals reviewed.  Constitutional:      General: He is not in acute distress. HENT:     Head: Normocephalic.  Neck:     Comments: (+) right submandibular nodule, firm, mobile, non tender. Cardiovascular:     Rate and Rhythm: Normal rate and regular rhythm.  Pulmonary:      Effort: Pulmonary effort is normal.     Breath sounds: Normal breath sounds.  Abdominal:     General: There is no distension.     Palpations: Abdomen is soft.     Tenderness: There is no abdominal tenderness.  Skin:    General: Skin is warm and dry.  Neurological:     Mental Status: He is alert and oriented to person, place, and time.  Psychiatric:        Mood and Affect: Mood normal.        Behavior: Behavior normal.        Thought Content: Thought content normal.  Judgment: Judgment normal.     Imaging: US SOFT TISSUE HEAD & NECK (NON-THYROID)  Result Date: 01/03/2020 CLINICAL DATA:  Right submandibular mass EXAM: ULTRASOUND OF HEAD/NECK SOFT TISSUES TECHNIQUE: Ultrasound examination of the head and neck soft tissues was performed in the area of clinical concern. COMPARISON:  None. FINDINGS: Hypoechoic mass in the right submandibular region measures 1.2 x 0.7 x 1.8 cm. IMPRESSION: Hypoechoic right submandibular mass, likely an enlarged lymph node. Consider correlation with CT of the neck with contrast. Electronically Signed   By: Ulyses Jarred M.D.   On: 01/03/2020 16:35    Labs:  CBC: Recent Labs    03/21/19 1442  WBC 7.7  HGB 14.2  HCT 42.2  PLT 214    COAGS: No results for input(s): INR, APTT in the last 8760 hours.  BMP: Recent Labs    03/21/19 1442 09/20/19 1552  NA 142 142  K 4.4 4.3  CL 106 105  CO2 19* 23  GLUCOSE 85 95  BUN 21 16  CALCIUM 9.4 9.5  CREATININE 1.19 1.15  GFRNONAA 68 71  GFRAA 78 82    LIVER FUNCTION TESTS: Recent Labs    03/21/19 1442 09/20/19 1552  BILITOT 0.3 0.3  AST 24 22  ALT 24 25  ALKPHOS 57 71  PROT 6.7 6.8  ALBUMIN 4.7 4.6    TUMOR MARKERS: No results for input(s): AFPTM, CEA, CA199, CHROMGRNA in the last 8760 hours.  Assessment and Plan:  59 y/o M with history of testicular cancer s/p left orchiectomy and radiation (2005) who presented to his PCP in March with complaints of jaw lump for several years,  he was ultimately seen by ENT and imaging ordered during that visit showed a 1.2 x 0.7 x 1.8 cm mass felt to be an enlarged lymph node. IR has been asked to perform a biopsy of this mass to further guide management.  Risks and benefits of right submandibular lymph node biopsy was discussed with the patient and/or patient's family including, but not limited to bleeding, infection, damage to adjacent structures or low yield requiring additional tests.  All of the questions were answered and there is agreement to proceed.  Consent signed and in chart.  Thank you for this interesting consult.  I greatly enjoyed meeting Micheal Khan and look forward to participating in their care.  A copy of this report was sent to the requesting provider on this date.  Electronically Signed: Joaquim Nam, PA-C 01/14/2020, 8:43 AM   I spent a total of  30 Minutes   in face to face in clinical consultation, greater than 50% of which was counseling/coordinating care for right submandibular lymph node biopsy.

## 2020-01-14 NOTE — Telephone Encounter (Signed)
-----   Message from Micheal Bellows, MD sent at 01/10/2020 10:16 AM EDT ----- Sherald Hess inform patient that he had 9 polyps all of them were adenomas.  I would suggest we repeat a colonoscopy in 1 year due to the number of polyps.  I would also suggest that we refer him for genetic testing because these are large number of polyps seen.  The purpose for genetic testing is to determine if he has any syndrome which will also determined that if we need to screen for any other cancers in himself and also in his children and other first-degree relatives.  If he is willing please refer him for the same  C/c Orene Desanctis, Lilia Argue, PA-C    Dr Micheal Bellows MD,MRCP [facility]) Gastroenterology/Hepatology Pager: [phone]

## 2020-01-14 NOTE — Discharge Instructions (Signed)

## 2020-01-14 NOTE — Telephone Encounter (Signed)
Called pt to inform him of biopsy results and Dr. Georgeann Oppenheim recommendations.  Unable to contact, LVM to return call

## 2020-01-15 LAB — SURGICAL PATHOLOGY

## 2020-01-22 ENCOUNTER — Other Ambulatory Visit: Payer: Self-pay

## 2020-01-22 DIAGNOSIS — K635 Polyp of colon: Secondary | ICD-10-CM

## 2020-01-22 DIAGNOSIS — D126 Benign neoplasm of colon, unspecified: Secondary | ICD-10-CM

## 2020-01-22 DIAGNOSIS — Z800 Family history of malignant neoplasm of digestive organs: Secondary | ICD-10-CM

## 2020-01-22 NOTE — Telephone Encounter (Signed)
Spoke with pt and informed him of biopsy results and Dr. Georgeann Oppenheim recommendations. Pt understands and agrees with referral to genetics.

## 2020-01-28 ENCOUNTER — Inpatient Hospital Stay: Payer: BC Managed Care – PPO | Attending: Genetic Counselor | Admitting: Genetic Counselor

## 2020-01-28 DIAGNOSIS — Z803 Family history of malignant neoplasm of breast: Secondary | ICD-10-CM

## 2020-01-28 DIAGNOSIS — Z800 Family history of malignant neoplasm of digestive organs: Secondary | ICD-10-CM

## 2020-01-28 DIAGNOSIS — Z86010 Personal history of colonic polyps: Secondary | ICD-10-CM

## 2020-01-28 DIAGNOSIS — Z808 Family history of malignant neoplasm of other organs or systems: Secondary | ICD-10-CM

## 2020-01-31 ENCOUNTER — Encounter: Payer: Self-pay | Admitting: Genetic Counselor

## 2020-01-31 DIAGNOSIS — Z860100 Personal history of colon polyps, unspecified: Secondary | ICD-10-CM | POA: Insufficient documentation

## 2020-01-31 DIAGNOSIS — Z808 Family history of malignant neoplasm of other organs or systems: Secondary | ICD-10-CM | POA: Insufficient documentation

## 2020-01-31 DIAGNOSIS — Z800 Family history of malignant neoplasm of digestive organs: Secondary | ICD-10-CM | POA: Insufficient documentation

## 2020-01-31 DIAGNOSIS — Z803 Family history of malignant neoplasm of breast: Secondary | ICD-10-CM | POA: Insufficient documentation

## 2020-01-31 NOTE — Progress Notes (Signed)
REFERRING PROVIDER: Jonathon Bellows, MD Micheal Khan,  [place] [id number]  PRIMARY PROVIDER:  Volney American, PA-C  PRIMARY REASON FOR VISIT:  1. Personal history of colonic polyps   2. Family history of colon cancer   3. Family history of melanoma   4. Family history of breast cancer     I connected with Micheal Khan on 01/28/2020 at 9:00 am EDT by Mychart video conference and verified that I am speaking with the correct person using two identifiers.   Patient location: Home Provider location: [facility] office  HISTORY OF PRESENT ILLNESS:   Micheal Khan, a 59 y.o. male, was seen for a [place] cancer genetics consultation at the request of Dr. Vicente Males due to a personal history of colon polyps.  Micheal Khan presents to clinic today to discuss the possibility of a hereditary predisposition to colon polyps/cancer, genetic testing, and to further clarify his future cancer risks, as well as potential cancer risks for family members.   Micheal Khan had a colonoscopy on 01/08/2020 which revealed 9 tubular adenomas. He plans to return for another colonoscopy in one year.   Micheal Khan also has a history of testicular cancer, diagnosed in 2005 at the age of 61. His treatment included surgery and chemotherapy.   Past Medical History:  Diagnosis Date  . Cancer ([place]) 07/2004   testicular-surgery and chemo  . Family history of breast cancer   . Family history of colon cancer   . Family history of melanoma   . GERD (gastroesophageal reflux disease)   . Hypertension     Past Surgical History:  Procedure Laterality Date  . COLONOSCOPY WITH PROPOFOL    . COLONOSCOPY WITH PROPOFOL N/A 01/08/2020   Procedure: COLONOSCOPY WITH PROPOFOL;  Surgeon: Jonathon Bellows, MD;  Location: [facility] ENDOSCOPY;  Service: Gastroenterology;  Laterality: N/A;  . ESOPHAGOGASTRODUODENOSCOPY (EGD) WITH PROPOFOL    . TESTICLE REMOVAL Left 07/2004    Social History   Socioeconomic History  . Marital status:  Married    Spouse name: Not on file  . Number of children: Not on file  . Years of education: Not on file  . Highest education level: Not on file  Occupational History  . Not on file  Tobacco Use  . Smoking status: Never Smoker  . Smokeless tobacco: Never Used  Substance and Sexual Activity  . Alcohol use: No    Alcohol/week: 0.0 standard drinks  . Drug use: No  . Sexual activity: Yes  Other Topics Concern  . Not on file  Social History Narrative  . Not on file   Social Determinants of Health   Financial Resource Strain:   . Difficulty of Paying Living Expenses:   Food Insecurity:   . Worried About Charity fundraiser in the Last Year:   . Arboriculturist in the Last Year:   Transportation Needs:   . Film/video editor (Medical):   Marland Kitchen Lack of Transportation (Non-Medical):   Physical Activity:   . Days of Exercise per Week:   . Minutes of Exercise per Session:   Stress:   . Feeling of Stress :   Social Connections:   . Frequency of Communication with Friends and Family:   . Frequency of Social Gatherings with Friends and Family:   . Attends Religious Services:   . Active Member of Clubs or Organizations:   . Attends Archivist Meetings:   Marland Kitchen Marital Status:  FAMILY HISTORY:  We obtained a detailed, 4-generation family history.  Significant diagnoses are listed below: Family History  Problem Relation Age of Onset  . Melanoma Mother 64       metastatic to brain  . Heart disease Father        MI  . Colon cancer Brother 66  . Cancer Maternal Grandfather        unknown type, maybe brain tumor  . Stroke Paternal Grandmother   . Heart disease Paternal Grandfather   . Breast cancer Maternal Aunt 73  . Colon polyps Paternal Uncle        less than 10 cumulative   Micheal Khan has two daughters (ages 45 and 67). He has one brother, Micheal Khan, who was recently diagnosed with colon cancer at the age of 66. His brother had normal genetic testing, although Mr.  Khan is not sure what type of genetic testing this was. He does not know if his brother also had colon polyps.  Micheal Khan mother died at the age of 52 from metastatic melanoma, which had been diagnosed at the age of 61. He does not think his mother ever had a colonoscopy. He had four maternal aunts and two maternal uncles. One aunt had breast cancer diagnosed at the age of 52. His maternal grandmother died at the age of 17 and may have had a history of skin cancer, and his maternal grandfather died at the age of 58 from an unknown cancer, which may have been brain cancer. There are no other known diagnoses of cancer on the maternal side of the family.  Micheal Khan father died at the age of 12 from a heart attack. He does not think his father had a colonoscopy. He had two paternal uncles who died in their 54s and 82s. One uncle had colon polyps, but had less than 10 cumulative polyps. His paternal grandparents died in their 36s. There are no known diagnoses of cancer on the paternal side of the family.  Micheal Khan maternal ancestors are of [place] and [place] descent, and paternal ancestors are of English descent. There is no reported Ashkenazi Jewish ancestry. There is no known consanguinity.  GENETIC COUNSELING ASSESSMENT: Micheal Khan is a 59 y.o. male with a personal history of multiple adenomatous colon polyps and a family history of colon cancer, which is somewhat suggestive of a hereditary polyposis/cancer syndrome. We, therefore, discussed and recommended the following at today's visit.   DISCUSSION: We discussed that polyps in general are common, however, most people have fewer than 5 lifetime polyps.  When an individual has 10 or more polyps we become concerned about an underlying polyposis syndrome.  The most common hereditary polyposis syndromes are Familial Adenomatous Polyposis (FAP), caused by mutations in the APC gene, and MUTYH-Associated Polyposis (MAP), caused by mutations in the MUTYH  gene.  There are other genes that are associated with polyposis, such as NTHL1 and MSH3.  We discussed that testing is beneficial for several reasons, including knowing about cancer risks, identifying potential screening and risk-reduction options that may be appropriate, and to understand if other family members could be at risk for colon polyps and/or cancer and allow them to undergo genetic testing.   We reviewed the characteristics, features and inheritance patterns of hereditary cancer syndromes. We also discussed genetic testing, including the appropriate family members to test, the process of testing, and insurance coverage. We discussed with Micheal Khan that the personal and family history does not meet insurance or NCCN  criteria for genetic testing. Insurance coverage criteria for genetic testing based on a history of colon polyps requires 10 or more cumulative adenomatous polyps, whereas Micheal Khan has had 9 adenomatous polyps. Micheal Khan would meet medical criteria for genetic testing if he develops any additional adenomatous colon polyps in the future. Because it is likely that he will develop more colon polyps in the future, it is reasonable to consider genetic testing at this time to determine if there are any additional cancer risks and screening recommendations that may be appropriate for him. We recommended Micheal Khan pursue genetic testing for a hereditary polyposis/colon cancer gene panel.   We discussed that he may have an out of pocket cost for genetic testing. If his insurance denies coverage, the out of pocket cost associated with genetic testing would be $250. Micheal Khan considered this and would not like to proceed with genetic testing at this time. While he is interested in genetic testing, he would like to wait until he meets insurance coverage criteria for testing. We understand this decision and recommended Mr. Laughman continue to follow the cancer screening guidelines given by his primary  healthcare provider.  PLAN:  Mr. Szwed did not wish to pursue genetic testing at today's visit. We understand this decision and remain available to coordinate genetic testing at any time in the future. We, therefore, recommend Mr. Likes continue to follow the cancer screening guidelines given by his primary healthcare provider.  Lastly, we encouraged Mr. Hagans to remain in contact with cancer genetics so that we can continuously update the family history and inform him of any changes in cancer genetics and testing that may be of benefit for this family.   Mr. Kishi questions were answered to his satisfaction today. Our contact information was provided should additional questions or concerns arise. Thank you for the referral and allowing [place] to share in the care of your patient.   Clint Guy, [place], [facility] Licensed, Certified Dispensing [email or web address] Phone: [phone]  The patient was seen for a total of 40 minutes in face-to-face genetic counseling.  This patient was discussed with Drs. Magrinat, Lindi Adie and/or Burr Medico who agrees with the above.    _______________________________________________________________________ For Office Staff:  Number of people involved in session: 1 Was an Intern/ student involved with case: no

## 2020-03-26 ENCOUNTER — Other Ambulatory Visit: Payer: Self-pay

## 2020-03-26 ENCOUNTER — Ambulatory Visit (INDEPENDENT_AMBULATORY_CARE_PROVIDER_SITE_OTHER): Payer: BC Managed Care – PPO | Admitting: Family Medicine

## 2020-03-26 ENCOUNTER — Encounter: Payer: Self-pay | Admitting: Family Medicine

## 2020-03-26 VITALS — BP 124/79 | HR 65 | Temp 98.2°F | Ht 73.5 in | Wt 278.0 lb

## 2020-03-26 DIAGNOSIS — K219 Gastro-esophageal reflux disease without esophagitis: Secondary | ICD-10-CM

## 2020-03-26 DIAGNOSIS — I10 Essential (primary) hypertension: Secondary | ICD-10-CM

## 2020-03-26 DIAGNOSIS — Z0000 Encounter for general adult medical examination without abnormal findings: Secondary | ICD-10-CM | POA: Diagnosis not present

## 2020-03-26 DIAGNOSIS — E782 Mixed hyperlipidemia: Secondary | ICD-10-CM | POA: Diagnosis not present

## 2020-03-26 DIAGNOSIS — Z125 Encounter for screening for malignant neoplasm of prostate: Secondary | ICD-10-CM | POA: Diagnosis not present

## 2020-03-26 LAB — UA/M W/RFLX CULTURE, ROUTINE
Bilirubin, UA: NEGATIVE
Glucose, UA: NEGATIVE
Ketones, UA: NEGATIVE
Leukocytes,UA: NEGATIVE
Nitrite, UA: NEGATIVE
Protein,UA: NEGATIVE
RBC, UA: NEGATIVE
Specific Gravity, UA: 1.02 (ref 1.005–1.030)
Urobilinogen, Ur: 0.2 mg/dL (ref 0.2–1.0)
pH, UA: 6.5 (ref 5.0–7.5)

## 2020-03-26 MED ORDER — PANTOPRAZOLE SODIUM 40 MG PO TBEC: 40.0000 mg | DELAYED_RELEASE_TABLET | Freq: Every day | ORAL | 1 refills | Status: DC

## 2020-03-26 MED ORDER — LISINOPRIL 5 MG PO TABS: 5.0000 mg | ORAL_TABLET | Freq: Every day | ORAL | 1 refills | Status: DC

## 2020-03-26 NOTE — Progress Notes (Signed)
BP 124/79   Pulse 65   Temp 98.2 F (36.8 C) (Oral)   Ht 6' 1.5" (1.867 m)   Wt 278 lb (126.1 kg)   SpO2 94%   BMI 36.18 kg/m    Subjective:    Patient ID: Micheal Khan, male    DOB: [birth year], 59 y.o.   MRN: [id number]  HPI: Micheal Khan is a 59 y.o. male presenting on 03/26/2020 for comprehensive medical examination. Current medical complaints include:see below  HTN - Home BPs running 110-120/70s. Taking faithfully without side effects. Denies CP, SOB, HAs, dizziness. Trying to eat healthy and stay active.   Protonix about 4 days per week working well for him to keep his reflux at [place]. No recent issues with gastritis since starting regimen.   He currently lives with: Interim Problems from his last visit: no  Depression Screen done today and results listed below:  Depression screen [facility] 2/9 03/26/2020 03/21/2019 03/15/2018 03/14/2017 02/25/2016  Decreased Interest 0 0 0 0 0  Down, Depressed, Hopeless 0 0 0 0 0  PHQ - 2 Score 0 0 0 0 0  Altered sleeping 0 0 - - -  Tired, decreased energy 0 0 - - -  Change in appetite 0 0 - - -  Feeling bad or failure about yourself  0 0 - - -  Trouble concentrating 0 0 - - -  Moving slowly or fidgety/restless 0 0 - - -  Suicidal thoughts 0 0 - - -  PHQ-9 Score 0 0 - - -    The patient does not have a history of falls. I did complete a risk assessment for falls. A plan of care for falls was documented.   Past Medical History:  Past Medical History:  Diagnosis Date  . Cancer ([place]) 07/2004   testicular-surgery and chemo  . Family history of breast cancer   . Family history of colon cancer   . Family history of melanoma   . GERD (gastroesophageal reflux disease)   . Hypertension     Surgical History:  Past Surgical History:  Procedure Laterality Date  . COLONOSCOPY WITH PROPOFOL    . COLONOSCOPY WITH PROPOFOL N/A 01/08/2020   Procedure: COLONOSCOPY WITH PROPOFOL;  Surgeon: Jonathon Bellows, MD;  Location: [facility] ENDOSCOPY;  Service:  Gastroenterology;  Laterality: N/A;  . ESOPHAGOGASTRODUODENOSCOPY (EGD) WITH PROPOFOL    . TESTICLE REMOVAL Left 07/2004    Medications:  Current Outpatient Medications on File Prior to Visit  Medication Sig  . cholecalciferol (VITAMIN D) 1000 units tablet Take 1,000 Units by mouth daily.  . Cyanocobalamin (VITAMIN B 12 PO) Take by mouth daily.  Marland Kitchen GARLIC OIL PO Take by mouth daily. 1500 mg  . Multiple Vitamin (ONE-A-DAY MENS PO) Take 1 tablet by mouth daily.  . Probiotic Product (PROBIOTIC DAILY PO) Take by mouth daily.  Marland Kitchen UNABLE TO FIND Apple cider vinagre. Liquid. One table spoon a day   No current facility-administered medications on file prior to visit.    Allergies:  No Known Allergies  Social History:  Social History   Socioeconomic History  . Marital status: Married    Spouse name: Not on file  . Number of children: Not on file  . Years of education: Not on file  . Highest education level: Not on file  Occupational History  . Not on file  Tobacco Use  . Smoking status: Never Smoker  . Smokeless tobacco: Never Used  Vaping Use  . Vaping Use: Never  used  Substance and Sexual Activity  . Alcohol use: No    Alcohol/week: 0.0 standard drinks  . Drug use: No  . Sexual activity: Yes  Other Topics Concern  . Not on file  Social History Narrative  . Not on file   Social Determinants of Health   Financial Resource Strain:   . Difficulty of Paying Living Expenses:   Food Insecurity:   . Worried About Charity fundraiser in the Last Year:   . Arboriculturist in the Last Year:   Transportation Needs:   . Film/video editor (Medical):   Marland Kitchen Lack of Transportation (Non-Medical):   Physical Activity:   . Days of Exercise per Week:   . Minutes of Exercise per Session:   Stress:   . Feeling of Stress :   Social Connections:   . Frequency of Communication with Friends and Family:   . Frequency of Social Gatherings with Friends and Family:   . Attends Religious  Services:   . Active Member of Clubs or Organizations:   . Attends Archivist Meetings:   Marland Kitchen Marital Status:   Intimate Partner Violence:   . Fear of Current or Ex-Partner:   . Emotionally Abused:   Marland Kitchen Physically Abused:   . Sexually Abused:    Social History   Tobacco Use  Smoking Status Never Smoker  Smokeless Tobacco Never Used   Social History   Substance and Sexual Activity  Alcohol Use No  . Alcohol/week: 0.0 standard drinks    Family History:  Family History  Problem Relation Age of Onset  . Melanoma Mother 28       metastatic to brain  . Heart disease Father        MI  . Colon cancer Brother 18  . Cancer Maternal Grandfather        unknown type, maybe brain tumor  . Stroke Paternal Grandmother   . Heart disease Paternal Grandfather   . Breast cancer Maternal Aunt 73  . Colon polyps Paternal Uncle        less than 10 cumulative    Past medical history, surgical history, medications, allergies, family history and social history reviewed with patient today and changes made to appropriate areas of the chart.   Review of Systems - General ROS: negative Psychological ROS: negative Ophthalmic ROS: negative ENT ROS: negative Allergy and Immunology ROS: negative Hematological and Lymphatic ROS: negative Endocrine ROS: negative Breast ROS: negative for breast lumps Respiratory ROS: no cough, shortness of breath, or wheezing Cardiovascular ROS: no chest pain or dyspnea on exertion Gastrointestinal ROS: no abdominal pain, change in bowel habits, or black or bloody stools Genito-Urinary ROS: no dysuria, trouble voiding, or hematuria Musculoskeletal ROS: negative Neurological ROS: no TIA or stroke symptoms Dermatological ROS: negative All other ROS negative except what is listed above and in the HPI.      Objective:    BP 124/79   Pulse 65   Temp 98.2 F (36.8 C) (Oral)   Ht 6' 1.5" (1.867 m)   Wt 278 lb (126.1 kg)   SpO2 94%   BMI 36.18 kg/m     Wt Readings from Last 3 Encounters:  03/26/20 278 lb (126.1 kg)  01/14/20 260 lb (117.9 kg)  01/08/20 259 lb (117.5 kg)    Physical Exam Vitals and nursing note reviewed.  Constitutional:      General: He is not in acute distress.    Appearance: He is well-developed.  HENT:     Head: Atraumatic.     Right Ear: Tympanic membrane and external ear normal.     Left Ear: Tympanic membrane and external ear normal.     Nose: Nose normal.     Mouth/Throat:     Mouth: Mucous membranes are moist.     Pharynx: Oropharynx is clear.  Eyes:     General: No scleral icterus.    Conjunctiva/sclera: Conjunctivae normal.     Pupils: Pupils are equal, round, and reactive to light.  Cardiovascular:     Rate and Rhythm: Normal rate and regular rhythm.     Heart sounds: Normal heart sounds. No murmur heard.   Pulmonary:     Effort: Pulmonary effort is normal. No respiratory distress.     Breath sounds: Normal breath sounds.  Abdominal:     General: Bowel sounds are normal. There is no distension.     Palpations: Abdomen is soft. There is no mass.     Tenderness: There is no abdominal tenderness. There is no guarding.  Genitourinary:    Comments: GU exam deferred with shared decision making Musculoskeletal:        General: No tenderness. Normal range of motion.     Cervical back: Normal range of motion and neck supple.  Skin:    General: Skin is warm and dry.     Findings: No rash.  Neurological:     General: No focal deficit present.     Mental Status: He is alert and oriented to person, place, and time.     Deep Tendon Reflexes: Reflexes are normal and symmetric.  Psychiatric:        Mood and Affect: Mood normal.        Behavior: Behavior normal.        Thought Content: Thought content normal.        Judgment: Judgment normal.     Results for orders placed or performed during the hospital encounter of 01/14/20  Surgical pathology  Result Value Ref Range   SURGICAL PATHOLOGY       SURGICAL PATHOLOGY CASE: [id number] PATIENT: [name] Surgical Pathology Report     Specimen Submitted: A. Lymph node/soft tissue, right submandibular region; biopsy  Clinical History: Palpable nodule within the submandibular region of the right side of the neck. Left orchiectomy in 2005 for seminoma.     DIAGNOSIS: A. LYMPH NODE/SOFT TISSUE, RIGHT SUBMANDIBULAR REGION; ULTRASOUND-GUIDED CORE BIOPSY: - FRAGMENTS OF SKELETAL MUSCLE AND ADIPOSE TISSUE.  Comment: No lymphoid tissue, salivary gland tissue, or cyst lining is identified. The sample may not be representative and correlation with all clinical and imaging findings is recommended.  GROSS DESCRIPTION: A. Labeled: Right submandibular lymph node Received: Fresh Tissue fragment(s): 4 Size: 0.6 x 0.2 x 0.1 cm Description: Aggregate of pink-red cylindrically shaped tissue fragments, filtered Entirely submitted in 1 cassette.   Final Diagnosis performed by Bryan Lemma, MD.   Electronically s igned 01/15/2020 11:03:42AM The electronic signature indicates that the named Attending Pathologist has evaluated the specimen Technical component performed at [facility], [address] [place], [place] [address] Lab: [phone] Dir: Rush Farmer, MD, MMM  Professional component performed at [facility], [facility], [place], [place], [place] [address] Lab: [phone] Dir: Dellia Nims. Reuel Derby, MD       Assessment & Plan:   Problem List Items Addressed This Visit      Cardiovascular and Mediastinum   Essential hypertension, benign - Primary    Stable and well  controlled, continue current regimen      Relevant Medications   lisinopril (ZESTRIL) 5 MG tablet   Other Relevant Orders   CBC with Differential/Platelet   Comprehensive metabolic panel   TSH   UA/M w/rflx Culture, Routine     Digestive   GERD (gastroesophageal reflux disease)    Stable, under good control. Continue protonix  regimen      Relevant Medications   pantoprazole (PROTONIX) 40 MG tablet     Other   Hyperlipidemia    Recheck lipids, continue diet and exercise changes for control      Relevant Medications   lisinopril (ZESTRIL) 5 MG tablet   Other Relevant Orders   Lipid Panel w/o Chol/HDL Ratio    Other Visit Diagnoses    Annual physical exam       Screening for prostate cancer       Relevant Orders   PSA       Discussed aspirin prophylaxis for myocardial infarction prevention and decision was made to continue ASA  LABORATORY TESTING:  Health maintenance labs ordered today as discussed above.   The natural history of prostate cancer and ongoing controversy regarding screening and potential treatment outcomes of prostate cancer has been discussed with the patient. The meaning of a false positive PSA and a false negative PSA has been discussed. He indicates understanding of the limitations of this screening test and wishes to proceed with screening PSA testing.   IMMUNIZATIONS:   - Tdap: Tetanus vaccination status reviewed: last tetanus booster within 10 years. - Influenza: Up to date  SCREENING: - Colonoscopy: Up to date  Discussed with patient purpose of the colonoscopy is to detect colon cancer at curable precancerous or early stages   PATIENT COUNSELING:    Sexuality: Discussed sexually transmitted diseases, partner selection, use of condoms, avoidance of unintended pregnancy  and contraceptive alternatives.   Advised to avoid cigarette smoking.  I discussed with the patient that most people either abstain from alcohol or drink within safe limits (<=14/week and <=4 drinks/occasion for males, <=7/weeks and <= 3 drinks/occasion for females) and that the risk for alcohol disorders and other health effects rises proportionally with the number of drinks per week and how often a drinker exceeds daily limits.  Discussed cessation/primary prevention of drug use and availability of  treatment for abuse.   Diet: Encouraged to adjust caloric intake to maintain  or achieve ideal body weight, to reduce intake of dietary saturated fat and total fat, to limit sodium intake by avoiding high sodium foods and not adding table salt, and to maintain adequate dietary potassium and calcium preferably from fresh fruits, vegetables, and low-fat dairy products.    stressed the importance of regular exercise  Injury prevention: Discussed safety belts, safety helmets, smoke detector, smoking near bedding or upholstery.   Dental health: Discussed importance of regular tooth brushing, flossing, and dental visits.   Follow up plan: NEXT PREVENTATIVE PHYSICAL DUE IN 1 YEAR. Return in about 6 months (around 09/25/2020) for 6 month f/u.

## 2020-03-26 NOTE — Assessment & Plan Note (Signed)
Recheck lipids, continue diet and exercise changes for control

## 2020-03-26 NOTE — Assessment & Plan Note (Signed)
Stable, under good control. Continue protonix regimen

## 2020-03-26 NOTE — Assessment & Plan Note (Signed)
Stable and well controlled, continue current regimen 

## 2020-03-27 ENCOUNTER — Encounter: Payer: Self-pay | Admitting: Family Medicine

## 2020-03-27 LAB — CBC WITH DIFFERENTIAL/PLATELET
Basophils Absolute: 0 10*3/uL (ref 0.0–0.2)
Basos: 1 %
EOS (ABSOLUTE): 0.2 10*3/uL (ref 0.0–0.4)
Eos: 2 %
Hematocrit: 39.7 % (ref 37.5–51.0)
Hemoglobin: 13.5 g/dL (ref 13.0–17.7)
Immature Grans (Abs): 0 10*3/uL (ref 0.0–0.1)
Immature Granulocytes: 1 %
Lymphocytes Absolute: 1.7 10*3/uL (ref 0.7–3.1)
Lymphs: 28 %
MCH: 31 pg (ref 26.6–33.0)
MCHC: 34 g/dL (ref 31.5–35.7)
MCV: 91 fL (ref 79–97)
Monocytes Absolute: 0.6 10*3/uL (ref 0.1–0.9)
Monocytes: 10 %
Neutrophils Absolute: 3.6 10*3/uL (ref 1.4–7.0)
Neutrophils: 58 %
Platelets: 215 10*3/uL (ref 150–450)
RBC: 4.35 x10E6/uL (ref 4.14–5.80)
RDW: 12.4 % (ref 11.6–15.4)
WBC: 6.1 10*3/uL (ref 3.4–10.8)

## 2020-03-27 LAB — COMPREHENSIVE METABOLIC PANEL
ALT: 18 IU/L (ref 0–44)
AST: 21 IU/L (ref 0–40)
Albumin/Globulin Ratio: 1.9 (ref 1.2–2.2)
Albumin: 4.4 g/dL (ref 3.8–4.9)
Alkaline Phosphatase: 72 IU/L (ref 48–121)
BUN/Creatinine Ratio: 15 (ref 9–20)
BUN: 17 mg/dL (ref 6–24)
Bilirubin Total: 0.2 mg/dL (ref 0.0–1.2)
CO2: 24 mmol/L (ref 20–29)
Calcium: 9 mg/dL (ref 8.7–10.2)
Chloride: 106 mmol/L (ref 96–106)
Creatinine, Ser: 1.14 mg/dL (ref 0.76–1.27)
GFR calc Af Amer: 82 mL/min/{1.73_m2} (ref >59)
GFR calc non Af Amer: 71 mL/min/{1.73_m2} (ref >59)
Globulin, Total: 2.3 g/dL (ref 1.5–4.5)
Glucose: 83 mg/dL (ref 65–99)
Potassium: 4.4 mmol/L (ref 3.5–5.2)
Sodium: 143 mmol/L (ref 134–144)
Total Protein: 6.7 g/dL (ref 6.0–8.5)

## 2020-03-27 LAB — LIPID PANEL W/O CHOL/HDL RATIO
Cholesterol, Total: 195 mg/dL (ref 100–199)
HDL: 40 mg/dL (ref >39)
LDL Chol Calc (NIH): 113 mg/dL — ABNORMAL HIGH (ref 0–99)
Triglycerides: 241 mg/dL — ABNORMAL HIGH (ref 0–149)
VLDL Cholesterol Cal: 42 mg/dL — ABNORMAL HIGH (ref 5–40)

## 2020-03-27 LAB — PSA: Prostate Specific Ag, Serum: 0.6 ng/mL (ref 0.0–4.0)

## 2020-03-27 LAB — TSH: TSH: 2.39 u[IU]/mL (ref 0.450–4.500)

## 2020-03-28 DIAGNOSIS — D2330 Other benign neoplasm of skin of unspecified part of face: Secondary | ICD-10-CM | POA: Diagnosis not present

## 2020-04-02 DIAGNOSIS — L918 Other hypertrophic disorders of the skin: Secondary | ICD-10-CM | POA: Diagnosis not present

## 2020-04-02 DIAGNOSIS — Z872 Personal history of diseases of the skin and subcutaneous tissue: Secondary | ICD-10-CM | POA: Diagnosis not present

## 2020-04-02 DIAGNOSIS — L578 Other skin changes due to chronic exposure to nonionizing radiation: Secondary | ICD-10-CM | POA: Diagnosis not present

## 2020-04-02 DIAGNOSIS — Z86018 Personal history of other benign neoplasm: Secondary | ICD-10-CM | POA: Diagnosis not present

## 2020-04-18 DIAGNOSIS — Z20822 Contact with and (suspected) exposure to covid-19: Secondary | ICD-10-CM | POA: Diagnosis not present

## 2020-04-18 DIAGNOSIS — J069 Acute upper respiratory infection, unspecified: Secondary | ICD-10-CM | POA: Diagnosis not present

## 2020-04-18 DIAGNOSIS — R070 Pain in throat: Secondary | ICD-10-CM | POA: Diagnosis not present

## 2020-05-20 ENCOUNTER — Encounter: Payer: Self-pay | Admitting: Family Medicine

## 2020-09-02 IMAGING — US US BIOPSY LYMPH NODE
1 series · 11 of 11 positions shown · non-contrast
Comparison: Soft tissue neck ultrasound-01/03/2020

MEDICATIONS:
None

ANESTHESIA/SEDATION:
None

COMPLICATIONS:
None immediate.

INDICATION: History of testicular cancer, now with palpable nodule within the
submandibular region of the right-side of neck. Please perform
ultrasound-guided biopsy for tissue diagnostic purposes.

EXAM:
ULTRASOUND-GUIDED BIOPSY OF INDETERMINATE NODULE WITHIN THE
SUBMANDIBULAR REGION OF THE RIGHT-SIDE OF THE NECK
TECHNIQUE: Informed written consent was obtained from the patient after a
discussion of the risks, benefits and alternatives to treatment.
Questions regarding the procedure were encouraged and answered.
Initial ultrasound scanning demonstrated grossly unchanged size and
appearance of the approximately 1.3 x 0.6 cm hypoechoic nodule
within the subcutaneous tissues of the submandibular region of the
right-side of the neck (image 2). An ultrasound image was saved for
documentation purposes. The procedure was planned. A timeout was
performed prior to the initiation of the procedure.

[Series 1: us biopsy lymph node · 0.04mm/px · 11 acquisitions, 11 frames shown]
[im 1/11]
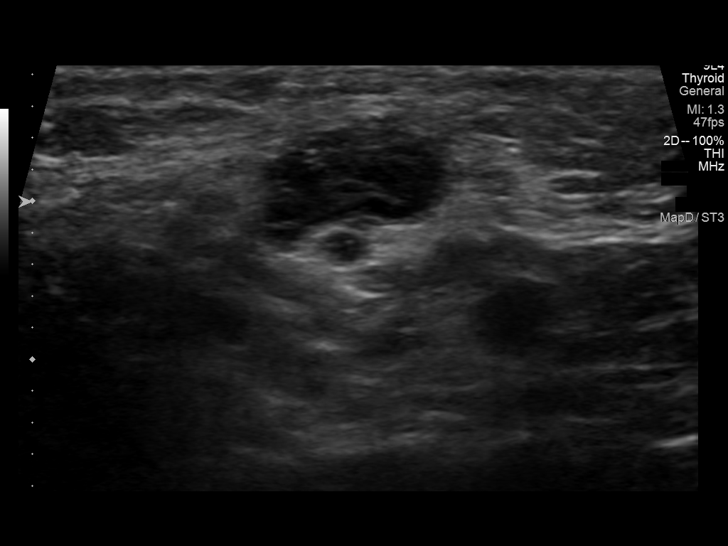
[im 2/11]
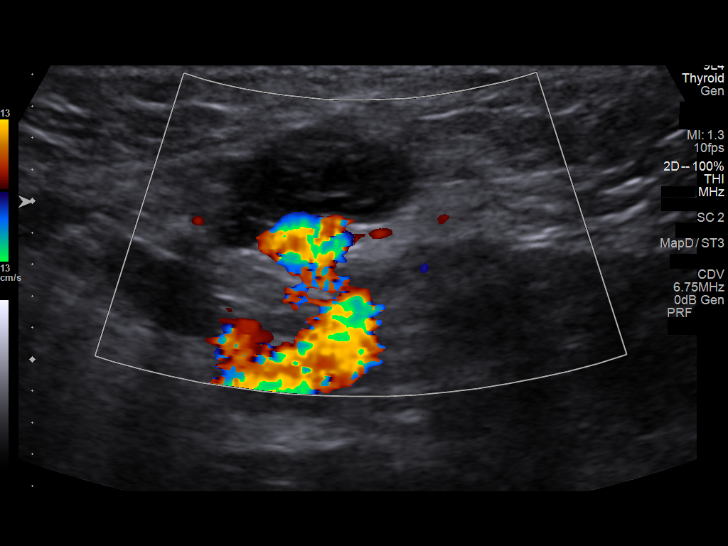
[im 3/11]
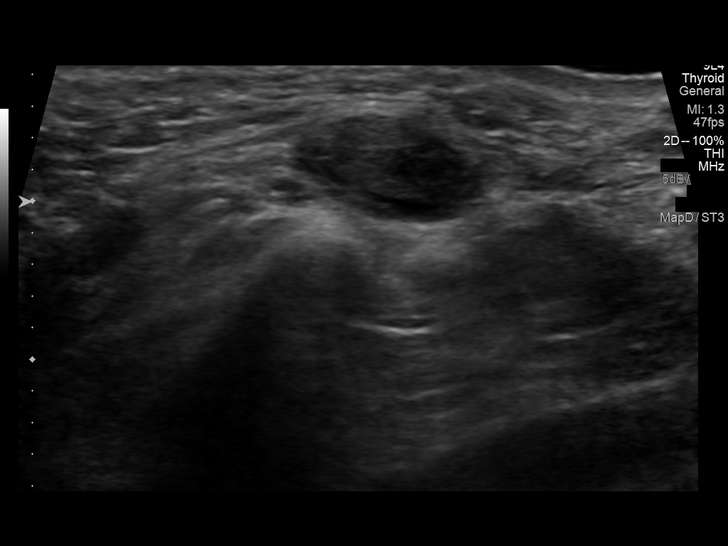
[im 4/11]
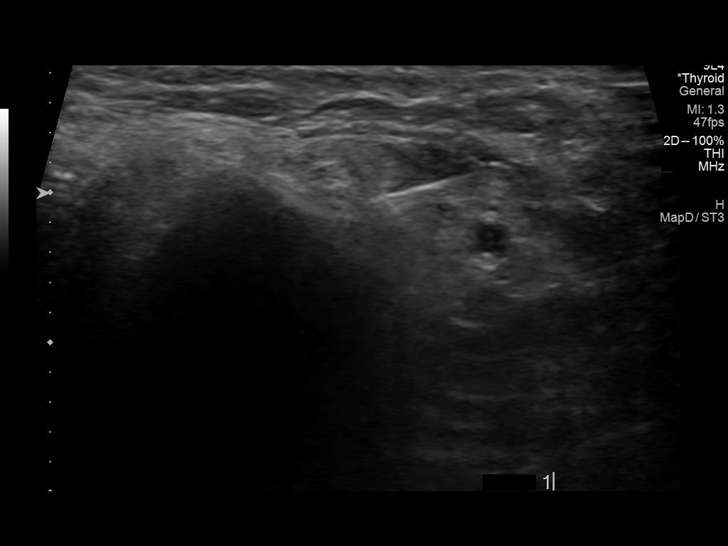
[im 5/11]
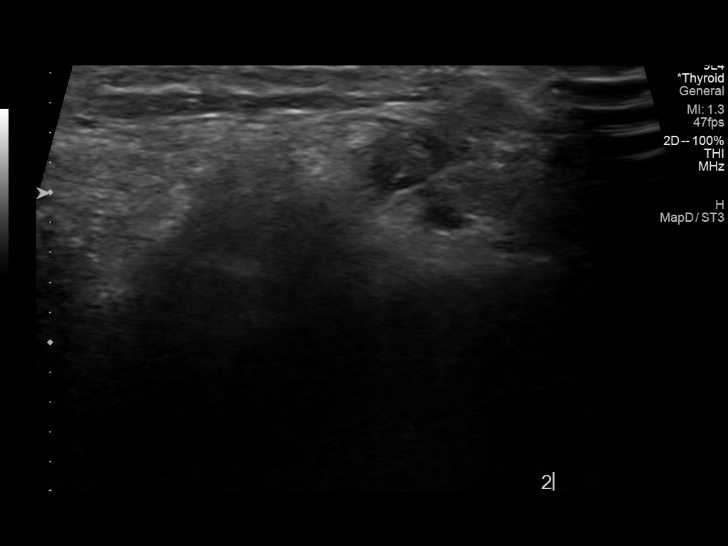
[im 6/11]
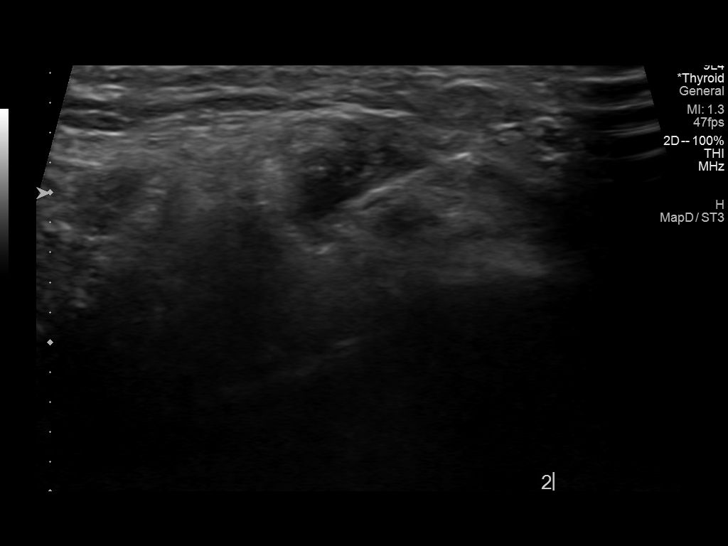
[im 7/11]
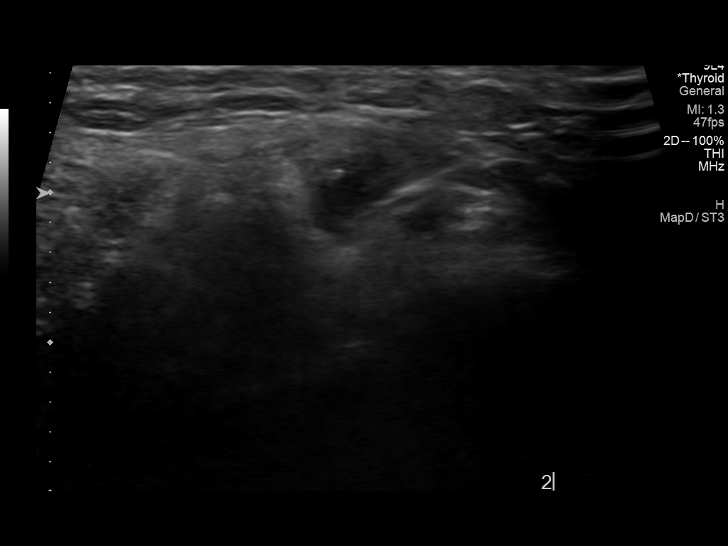
[im 8/11]
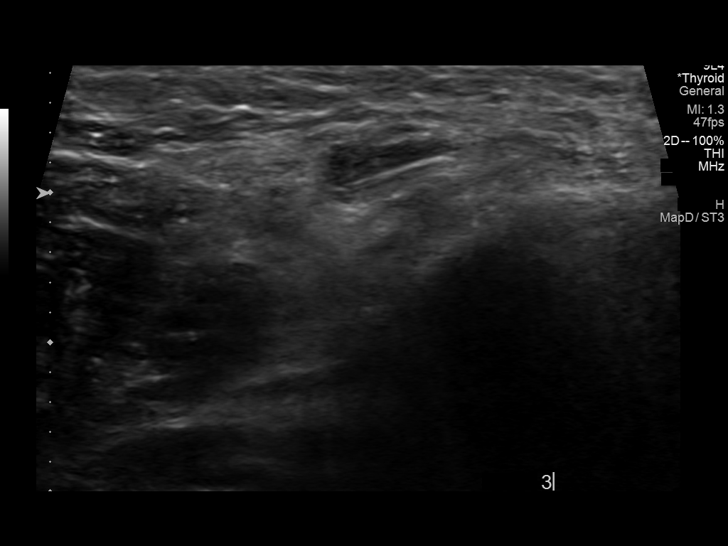
[im 9/11]
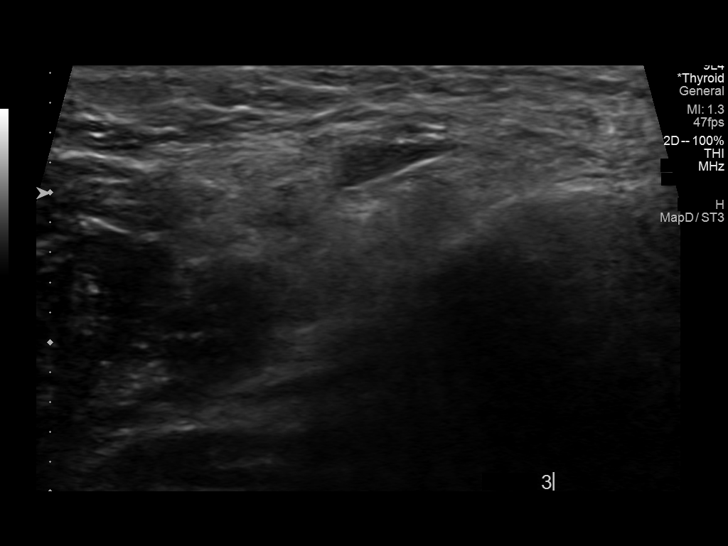
[im 10/11]
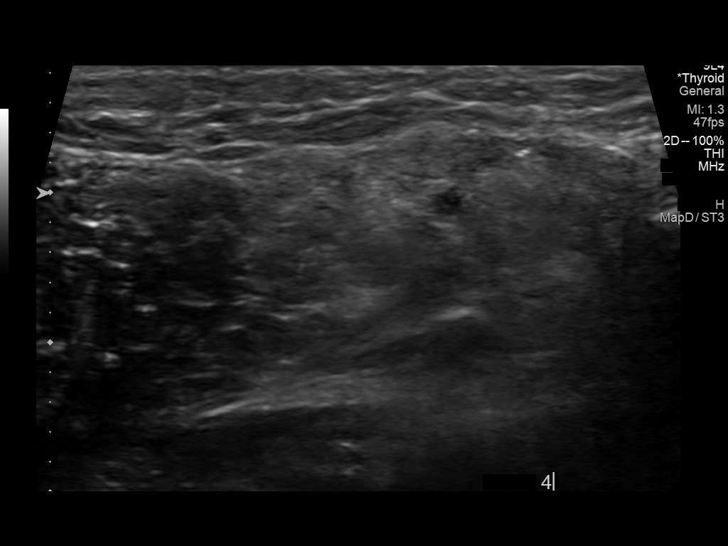
[im 11/11]
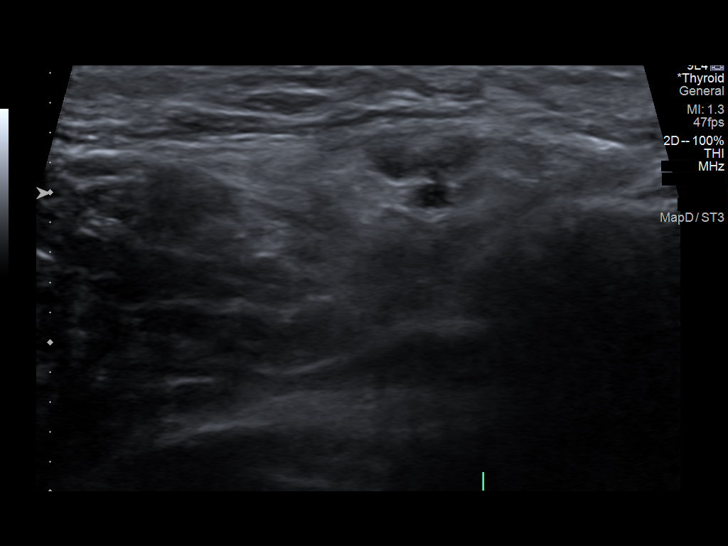

[11 of 11 positions shown; findings below may reference images not displayed]

The operative was prepped and draped in the usual sterile fashion,
and a sterile drape was applied covering the operative field. A
timeout was performed prior to the initiation of the procedure.
Local anesthesia was provided with 1% lidocaine with epinephrine.

Under direct ultrasound guidance, an 18 gauge core needle device was
utilized to obtain to obtain 3 core needle biopsies of the
indeterminate nodule within the subcutaneous tissues of the
submandibular region of the right-side of the neck.

Note, following acquisition of the 3rd core needle biopsy, the
nodule/cyst was noted to significantly reduced in size, nearly
resolve, and as such additional samples were unable to be acquired.

The samples were placed in saline and submitted to pathology. The
needle was removed and hemostasis was achieved with manual
compression. Post procedure scan was negative for significant
hematoma. A dressing was placed. The patient tolerated the procedure
well without immediate postprocedural complication.
IMPRESSION: Technically successful ultrasound guided biopsy of indeterminate
nodule/cyst within the submandibular region of the right-side of the
neck. Note, following acquisition of the 3rd core needle biopsy
sample, the nodule/cyst was noted to nearly resolve and additional
samples were unable to be acquired.

## 2020-09-08 ENCOUNTER — Encounter: Payer: Self-pay | Admitting: Nurse Practitioner

## 2020-09-08 ENCOUNTER — Other Ambulatory Visit: Payer: Self-pay

## 2020-09-08 ENCOUNTER — Ambulatory Visit (INDEPENDENT_AMBULATORY_CARE_PROVIDER_SITE_OTHER): Payer: BC Managed Care – PPO | Admitting: Nurse Practitioner

## 2020-09-08 VITALS — BP 138/89 | HR 65 | Temp 98.5°F | Ht 74.53 in | Wt 278.1 lb

## 2020-09-08 DIAGNOSIS — R079 Chest pain, unspecified: Secondary | ICD-10-CM

## 2020-09-08 DIAGNOSIS — K219 Gastro-esophageal reflux disease without esophagitis: Secondary | ICD-10-CM

## 2020-09-08 MED ORDER — FAMOTIDINE 20 MG PO TABS: 20.0000 mg | ORAL_TABLET | Freq: Every day | ORAL | 0 refills | Status: DC

## 2020-09-08 NOTE — Assessment & Plan Note (Signed)
Acute, ongoing.  No chest pain today in office-comes at random moments.  EKG in office did not show significant change from prior EKG about 1 year ago.  No red flags in history or on examination today.  Given significant family history of cardiovascular disease, will place referral to cardiology for further work-up if indicated.  Keep follow-up for couple of weeks in office as scheduled for labs.

## 2020-09-08 NOTE — Patient Instructions (Signed)

## 2020-09-08 NOTE — Progress Notes (Signed)
BP 138/89   Pulse 65   Temp 98.5 F (36.9 C)   Ht 6' 2.53" (1.893 m)   Wt 278 lb 2 oz (126.2 kg)   SpO2 95%   BMI 35.20 kg/m    Subjective:    Patient ID: Micheal Khan, male    DOB: January 26, 1961, 59 y.o.   MRN: [id number]  HPI: Micheal Khan is a 59 y.o. male presenting with abdominal pain.  Chief Complaint  Patient presents with  . Abdominal Pain    Patient states that he has a history of gastritis, he has been using protontix  4 days a week, but went back to 7 days a week   ABDOMINAL PAIN  History of gastritis; last December was started on pantoprazole daily.  Cut back to 4 days per week in June.  Had to increase pantoprazole use to 7 days per week in October, 2021.  Again, try to cut back about 2-3 weeks ago.  Has been unsuccessful in getting back on pantoprazole and is worried because on Sunday, his abdominal pain "hit" him and he was " miserable".  The pain prevented him from his normal ADLs. Duration: Days Onset: sudden Severity: 7-8/10 Quality: general ache all over Location:  peri-umbilical  Episode duration: days Radiation: no Frequency: constant Alleviating factors: Tums and Mylanta, Pepto Bismol Aggravating factors: Eating heavy foods, not taking pantoprazole Status: Worsening Treatments attempted: Tums, Mylanta, Pepto Bismol Fever: no Nausea: no Vomiting: no Weight loss: no Decreased appetite: no Diarrhea: no Constipation: no Blood in stool: no Heartburn: yes Jaundice: no Rash: no Dysuria/urinary frequency: no Hematuria: no History of sexually transmitted disease: no Recurrent NSAID use: no; has been months since last took ibuprofen EGD: Yes, in past.  Diagnosed with gastritis in past.  CHEST PAIN Father died of heart attack at age 45, grandfather had heart problems.     Feels his heart pounding more frequently.  Had EKG last year and stress test 6.5 years ago.  Is worrisome to him due to family history. Time since onset: Months Duration:  Seconds Onset: sudden Quality: Twinge Severity: Mild Location: Various areas on chest-both right and left side Radiation: none Episode duration: Seconds Frequency: Weekly Related to exertion: no Activity when pain started: Does not recall Trauma: no Anxiety/recent stressors: yes; turns 60 in a couple months and this is the age that his dad died of heart attack Aggravating factors: Nothing Alleviating factors: Nothing Status: Stable Treatments attempted: Nothing Current pain status: No pain  Shortness of breath: no Cough: no Nausea: no Diaphoresis: no Heartburn: yes Palpitations: no   No Known Allergies  Outpatient Encounter Medications as of 09/08/2020  Medication Sig  . cholecalciferol (VITAMIN D) 1000 units tablet Take 1,000 Units by mouth daily.  . Cyanocobalamin (VITAMIN B 12 PO) Take by mouth daily.  . famotidine (PEPCID) 20 MG tablet Take 1 tablet (20 mg total) by mouth daily.  Marland Kitchen GARLIC OIL PO Take by mouth daily. 1500 mg  . lisinopril (ZESTRIL) 5 MG tablet Take 1 tablet (5 mg total) by mouth daily.  . Multiple Vitamin (ONE-A-DAY MENS PO) Take 1 tablet by mouth daily.  . pantoprazole (PROTONIX) 40 MG tablet Take 1 tablet (40 mg total) by mouth daily.  . Probiotic Product (PROBIOTIC DAILY PO) Take by mouth daily.  Marland Kitchen UNABLE TO FIND Apple cider vinagre. Liquid. One table spoon a day   No facility-administered encounter medications on file as of 09/08/2020.   Patient Active Problem List   Diagnosis  Date Noted  . Chest pain 09/08/2020  . Personal history of colonic polyps 01/31/2020  . Family history of colon cancer   . Family history of melanoma   . Family history of breast cancer   . Hyperlipidemia 09/20/2019  . Anxiety 07/23/2018  . Obesity (BMI 35.0-39.9 without comorbidity) 03/15/2018  . Abdominal pain 05/13/2017  . Testicular cancer ([place]) 05/23/2015  . GERD (gastroesophageal reflux disease) 05/23/2015  . Essential hypertension, benign 05/23/2015   Past  Medical History:  Diagnosis Date  . Cancer ([place]) 07/2004   testicular-surgery and chemo  . Family history of breast cancer   . Family history of colon cancer   . Family history of melanoma   . GERD (gastroesophageal reflux disease)   . Hypertension    Relevant past medical, surgical, family and social history reviewed and updated as indicated. Interim medical history since our last visit reviewed.  Review of Systems  Constitutional: Positive for appetite change. Negative for activity change, chills, fatigue, fever and unexpected weight change.  Respiratory: Negative.  Negative for chest tightness, shortness of breath and wheezing.   Cardiovascular: Positive for palpitations. Negative for chest pain and leg swelling.  Gastrointestinal: Positive for abdominal pain. Negative for anal bleeding, blood in stool, constipation, diarrhea, nausea and vomiting.       + heartburn  Genitourinary: Negative.  Negative for discharge, dysuria, flank pain and hematuria.  Skin: Negative.  Negative for color change and rash.  Neurological: Negative.   Psychiatric/Behavioral: Negative.     Per HPI unless specifically indicated above     Objective:    BP 138/89   Pulse 65   Temp 98.5 F (36.9 C)   Ht 6' 2.53" (1.893 m)   Wt 278 lb 2 oz (126.2 kg)   SpO2 95%   BMI 35.20 kg/m   Wt Readings from Last 3 Encounters:  09/08/20 278 lb 2 oz (126.2 kg)  03/26/20 278 lb (126.1 kg)  01/14/20 260 lb (117.9 kg)    Physical Exam Vitals and nursing note reviewed.  Constitutional:      General: He is not in acute distress.    Appearance: He is well-developed. He is obese. He is not toxic-appearing.  Eyes:     General: No scleral icterus.    Extraocular Movements: Extraocular movements intact.  Cardiovascular:     Rate and Rhythm: Normal rate and regular rhythm.     Heart sounds: Normal heart sounds. No murmur heard.   Pulmonary:     Effort: Pulmonary effort is normal. No respiratory distress.      Breath sounds: Normal breath sounds. No wheezing, rhonchi or rales.  Abdominal:     General: Bowel sounds are normal.     Palpations: Abdomen is soft. There is no shifting dullness.     Tenderness: There is no abdominal tenderness.  Skin:    General: Skin is warm and dry.     Capillary Refill: Capillary refill takes less than 2 seconds.     Coloration: Skin is not jaundiced or pale.     Findings: No rash.  Neurological:     General: No focal deficit present.     Mental Status: He is alert and oriented to person, place, and time.  Psychiatric:        Mood and Affect: Mood normal. Mood is not anxious or depressed.        Behavior: Behavior normal.        Assessment & Plan:   Problem List Items  Addressed This Visit      Digestive   GERD (gastroesophageal reflux disease) - Primary    Chronic, ongoing.  This is likely the cause of recent increase in abdominal pain.  We will continue pantoprazole daily and add in Pepcid daily.  If symptoms do not resolve after a couple days, okay to increase Pepcid to twice a day.  If symptoms improve, will continue treatment for couple of weeks and then try to wean off of Pepcid.  If symptoms persist, consider referral to GI.       Relevant Medications   famotidine (PEPCID) 20 MG tablet     Other   Chest pain    Acute, ongoing.  No chest pain today in office-comes at random moments.  EKG in office did not show significant change from prior EKG about 1 year ago.  No red flags in history or on examination today.  Given significant family history of cardiovascular disease, will place referral to cardiology for further work-up if indicated.  Keep follow-up for couple of weeks in office as scheduled for labs.      Relevant Orders   EKG 12-Lead (Completed)   Ambulatory referral to Cardiology       Follow up plan: Return for as scheduled.  30+ minutes spent with patient today.

## 2020-09-08 NOTE — Assessment & Plan Note (Addendum)
Chronic, ongoing.  This is likely the cause of recent increase in abdominal pain.  We will continue pantoprazole daily and add in Pepcid daily.  If symptoms do not resolve after a couple days, okay to increase Pepcid to twice a day.  If symptoms improve, will continue treatment for couple of weeks and then try to wean off of Pepcid.  If symptoms persist, consider referral to GI.

## 2020-09-16 ENCOUNTER — Ambulatory Visit (INDEPENDENT_AMBULATORY_CARE_PROVIDER_SITE_OTHER): Payer: BC Managed Care – PPO | Admitting: Cardiology

## 2020-09-16 ENCOUNTER — Other Ambulatory Visit: Payer: Self-pay

## 2020-09-16 ENCOUNTER — Encounter: Payer: Self-pay | Admitting: Cardiology

## 2020-09-16 VITALS — BP 132/82 | HR 58 | Ht 74.0 in | Wt 275.2 lb

## 2020-09-16 DIAGNOSIS — R072 Precordial pain: Secondary | ICD-10-CM

## 2020-09-16 DIAGNOSIS — I10 Essential (primary) hypertension: Secondary | ICD-10-CM

## 2020-09-16 MED ORDER — METOPROLOL TARTRATE 50 MG PO TABS
50.0000 mg | ORAL_TABLET | Freq: Once | ORAL | 0 refills | Status: DC
Start: 2020-09-16 — End: 2020-10-28

## 2020-09-16 NOTE — Progress Notes (Signed)
Cardiology Office Note:    Date:  09/16/2020   ID:  [name], DOB April 17, 1961, MRN [id number]  PCP:  Volney American, PA-C  [place] Cardiologist:  Kate Sable, MD  [place] Electrophysiologist:  None   Referring MD: Eulogio Bear, NP   Chief Complaint  Patient presents with  . OTHER    Chest pain no complaints today. Meds reviewed verbally with pt.   SREEKAR BROYHILL is a 59 y.o. male who is being seen today for the evaluation of chestpain at the request of Eulogio Bear, NP.   History of Present Illness:    JONMARC BODKIN is a 59 y.o. male with a hx of hypertension who presents due to chest pain.  Patient states having symptoms of chest discomfort over the past 2 months.  Rates chest pain as 2 out of 10 in severity, lasting a few minutes.  Symptoms are not related with exertion.  States having a stress test/stress echo about 6 years ago at [facility] for chest pain which was normal.  He was later diagnosed with gastritis, PPIs has helped his symptoms overall.  States his symptoms have improved over the past week.  He is concerned about his symptoms due to family history of heart disease. Father died from heart attack in his 12s.  Past Medical History:  Diagnosis Date  . Cancer ([place]) 07/2004   testicular-surgery and chemo  . Family history of breast cancer   . Family history of colon cancer   . Family history of melanoma   . GERD (gastroesophageal reflux disease)   . Hypertension     Past Surgical History:  Procedure Laterality Date  . COLONOSCOPY WITH PROPOFOL    . COLONOSCOPY WITH PROPOFOL N/A 01/08/2020   Procedure: COLONOSCOPY WITH PROPOFOL;  Surgeon: Jonathon Bellows, MD;  Location: [facility] ENDOSCOPY;  Service: Gastroenterology;  Laterality: N/A;  . ESOPHAGOGASTRODUODENOSCOPY (EGD) WITH PROPOFOL    . TESTICLE REMOVAL Left 07/2004    Current Medications: Current Meds  Medication Sig  . cholecalciferol (VITAMIN D) 1000 units tablet Take  1,000 Units by mouth daily.  . Cyanocobalamin (VITAMIN B 12 PO) Take by mouth daily.  . famotidine (PEPCID) 20 MG tablet Take 1 tablet (20 mg total) by mouth daily.  Marland Kitchen GARLIC OIL PO Take by mouth daily. 1500 mg  . lisinopril (ZESTRIL) 5 MG tablet Take 1 tablet (5 mg total) by mouth daily.  . Multiple Vitamin (ONE-A-DAY MENS PO) Take 1 tablet by mouth daily.  . pantoprazole (PROTONIX) 40 MG tablet Take 1 tablet (40 mg total) by mouth daily.  . Probiotic Product (PROBIOTIC DAILY PO) Take by mouth daily.  Marland Kitchen UNABLE TO FIND Apple cider vinagre. Liquid. One table spoon a day     Allergies:   Patient has no known allergies.   Social History   Socioeconomic History  . Marital status: Married    Spouse name: Not on file  . Number of children: Not on file  . Years of education: Not on file  . Highest education level: Not on file  Occupational History  . Not on file  Tobacco Use  . Smoking status: Never Smoker  . Smokeless tobacco: Never Used  Vaping Use  . Vaping Use: Never used  Substance and Sexual Activity  . Alcohol use: No    Alcohol/week: 0.0 standard drinks  . Drug use: No  . Sexual activity: Yes  Other Topics Concern  . Not on file  Social History Narrative  .  Not on file   Social Determinants of Health   Financial Resource Strain:   . Difficulty of Paying Living Expenses: Not on file  Food Insecurity:   . Worried About Charity fundraiser in the Last Year: Not on file  . Ran Out of Food in the Last Year: Not on file  Transportation Needs:   . Lack of Transportation (Medical): Not on file  . Lack of Transportation (Non-Medical): Not on file  Physical Activity:   . Days of Exercise per Week: Not on file  . Minutes of Exercise per Session: Not on file  Stress:   . Feeling of Stress : Not on file  Social Connections:   . Frequency of Communication with Friends and Family: Not on file  . Frequency of Social Gatherings with Friends and Family: Not on file  . Attends  Religious Services: Not on file  . Active Member of Clubs or Organizations: Not on file  . Attends Archivist Meetings: Not on file  . Marital Status: Not on file     Family History: The patient's family history includes Breast cancer (age of onset: 59) in his maternal aunt; Cancer in his maternal grandfather; Colon cancer (age of onset: 21) in his brother; Colon polyps in his paternal uncle; Heart disease in his father and paternal grandfather; Melanoma (age of onset: 21) in his mother; Stroke in his paternal grandmother.  ROS:   Please see the history of present illness.     All other systems reviewed and are negative.  EKGs/Labs/Other Studies Reviewed:    The following studies were reviewed today:   EKG:  EKG is  ordered today.  The ekg ordered today demonstrates sinus bradycardia, first-degree AV block.  Recent Labs: 03/26/2020: ALT 18; BUN 17; Creatinine, Ser 1.14; Hemoglobin 13.5; Platelets 215; Potassium 4.4; Sodium 143; TSH 2.390  Recent Lipid Panel    Component Value Date/Time   CHOL 195 03/26/2020 1426   TRIG 241 (H) 03/26/2020 1426   HDL 40 03/26/2020 1426   LDLCALC 113 (H) 03/26/2020 1426     Risk Assessment/Calculations:      Physical Exam:    VS:  BP 132/82 (BP Location: Right Arm, Patient Position: Sitting, Cuff Size: Large)   Pulse (!) 58   Ht '6\' 2"'  (1.88 m)   Wt 275 lb 4 oz (124.9 kg)   SpO2 98%   BMI 35.34 kg/m     Wt Readings from Last 3 Encounters:  09/16/20 275 lb 4 oz (124.9 kg)  09/08/20 278 lb 2 oz (126.2 kg)  03/26/20 278 lb (126.1 kg)     GEN:  Well nourished, well developed in no acute distress HEENT: Normal NECK: No JVD; No carotid bruits LYMPHATICS: No lymphadenopathy CARDIAC: RRR, no murmurs, rubs, gallops RESPIRATORY:  Clear to auscultation without rales, wheezing or rhonchi  ABDOMEN: Soft, non-tender, non-distended MUSCULOSKELETAL:  No edema; No deformity  SKIN: Warm and dry NEUROLOGIC:  Alert and oriented x  3 PSYCHIATRIC:  Normal affect   ASSESSMENT:    1. Precordial pain   2. Primary hypertension    PLAN:    In order of problems listed above:  1. Patient with chest pain, risk factors of hypertension, family history of CAD.  Get echocardiogram, coronary CTA to evaluate presence of CAD. 2. History of hypertension, BP controlled.  Continue lisinopril as prescribed.  Follow-up after echocardiogram and coronary CTA.   Shared Decision Making/Informed Consent       Medication Adjustments/Labs and  Tests Ordered: Current medicines are reviewed at length with the patient today.  Concerns regarding medicines are outlined above.  Orders Placed This Encounter  Procedures  . CT CORONARY MORPH W/CTA COR W/SCORE W/CA W/CM &/OR WO/CM  . CT CORONARY FRACTIONAL FLOW RESERVE DATA PREP  . CT CORONARY FRACTIONAL FLOW RESERVE FLUID ANALYSIS  . EKG 12-Lead  . ECHOCARDIOGRAM COMPLETE   Meds ordered this encounter  Medications  . metoprolol tartrate (LOPRESSOR) 50 MG tablet    Sig: Take 1 tablet (50 mg total) by mouth once for 1 dose. Take 2 hours prior to your CT scan.    Dispense:  1 tablet    Refill:  0    Patient Instructions  Medication Instructions:  Your physician recommends that you continue on your current medications as directed. Please refer to the Current Medication list given to you today.  *If you need a refill on your cardiac medications before your next appointment, please call your pharmacy*   Lab Work: None Ordered If you have labs (blood work) drawn today and your tests are completely normal, you will receive your results only by: Marland Kitchen MyChart Message (if you have MyChart) OR . A paper copy in the mail If you have any lab test that is abnormal or we need to change your treatment, we will call you to review the results.   Testing/Procedures:  1. Your physician has requested that you have an echocardiogram. Echocardiography is a painless test that uses sound waves to create  images of your heart. It provides your doctor with information about the size and shape of your heart and how well your heart's chambers and valves are working. This procedure takes approximately one hour. There are no restrictions for this procedure.  2.  Your physician has requested that you have cardiac CT. Cardiac computed tomography (CT) is a painless test that uses an x-ray machine to take clear, detailed pictures of your heart.   Your cardiac CT will be scheduled at one of the below locations:   [facility] [address] [place], [place] [address] [phone]  [facility] [address] [place], [place] [address] [phone]  If scheduled at [facility], please arrive at the [facility] main entrance of [facility] 30 minutes prior to test start time. Proceed to the [facility] Radiology Department (first floor) to check-in and test prep.  If scheduled at [facility], please arrive 15 mins early for check-in and test prep.  Please follow these instructions carefully (unless otherwise directed):  Hold all erectile dysfunction medications at least 3 days (72 hrs) prior to test.  On the Night Before the Test: . Be sure to Drink plenty of water. . Do not consume any caffeinated/decaffeinated beverages or chocolate 12 hours prior to your test. . Do not take any antihistamines 12 hours prior to your test.   On the Day of the Test: . Drink plenty of water. Do not drink any water within one hour of the test. . Do not eat any food 4 hours prior to the test. . You may take your regular medications prior to the test.  . Take metoprolol (Lopressor) two hours prior to test.       After the Test: . Drink plenty of water. . After receiving IV contrast, you may experience a mild flushed feeling. This is normal. . On occasion, you may experience a mild rash up to  24 hours after  the test. This is not dangerous. If this occurs, you can take Benadryl 25 mg and increase your fluid intake. . If you experience trouble breathing, this can be serious. If it is severe call 911 IMMEDIATELY. If it is mild, please call our office.    Once we have confirmed authorization from your insurance company, we will call you to set up a date and time for your test. Based on how quickly your insurance processes prior authorizations requests, please allow up to 4 weeks to be contacted for scheduling your Cardiac CT appointment. Be advised that routine Cardiac CT appointments could be scheduled as many as 8 weeks after your provider has ordered it.  For non-scheduling related questions, please contact the cardiac imaging nurse navigator should you have any questions/concerns: Marchia Bond, Cardiac Imaging Nurse Navigator Burley Saver, Interim Cardiac Imaging Nurse [place] [facility] Services Direct Office Dial: [phone]   For scheduling needs, including cancellations and rescheduling, please call [place], [phone].    Follow-Up: At [facility], you and your health needs are our priority.  As part of our continuing mission to provide you with exceptional heart care, we have created designated Provider Care Teams.  These Care Teams include your primary Cardiologist (physician) and Advanced Practice Providers (APPs -  Physician Assistants and Nurse Practitioners) who all work together to provide you with the care you need, when you need it.  We recommend signing up for the patient portal called "MyChart".  Sign up information is provided on this After Visit Summary.  MyChart is used to connect with patients for Virtual Visits (Telemedicine).  Patients are able to view lab/test results, encounter notes, upcoming appointments, etc.  Non-urgent messages can be sent to your provider as well.   To learn more about what you can do with MyChart, go to  [email or web address].    Your next appointment:   5 weeks   The format for your next appointment:   In Person  Provider:   Kate Sable, MD   Other Instructions      Signed, Kate Sable, MD  09/16/2020 10:35 AM    [place]

## 2020-09-16 NOTE — Patient Instructions (Signed)
Medication Instructions:  Your physician recommends that you continue on your current medications as directed. Please refer to the Current Medication list given to you today.  *If you need a refill on your cardiac medications before your next appointment, please call your pharmacy*   Lab Work: None Ordered If you have labs (blood work) drawn today and your tests are completely normal, you will receive your results only by: Marland Kitchen MyChart Message (if you have MyChart) OR . A paper copy in the mail If you have any lab test that is abnormal or we need to change your treatment, we will call you to review the results.   Testing/Procedures:  1. Your physician has requested that you have an echocardiogram. Echocardiography is a painless test that uses sound waves to create images of your heart. It provides your doctor with information about the size and shape of your heart and how well your heart's chambers and valves are working. This procedure takes approximately one hour. There are no restrictions for this procedure.  2.  Your physician has requested that you have cardiac CT. Cardiac computed tomography (CT) is a painless test that uses an x-ray machine to take clear, detailed pictures of your heart.   Your cardiac CT will be scheduled at one of the below locations:   [facility] [address] [place], [place] [address] [phone]  [facility] [address] [place], [place] [address] [phone]  If scheduled at [facility], please arrive at the [facility] main entrance of [facility] 30 minutes prior to test start time. Proceed to the [facility] Radiology Department (first floor) to check-in and test prep.  If scheduled at [facility], please arrive 15 mins early for check-in and test prep.  Please follow these instructions carefully (unless otherwise directed):  Hold all  erectile dysfunction medications at least 3 days (72 hrs) prior to test.  On the Night Before the Test: . Be sure to Drink plenty of water. . Do not consume any caffeinated/decaffeinated beverages or chocolate 12 hours prior to your test. . Do not take any antihistamines 12 hours prior to your test.   On the Day of the Test: . Drink plenty of water. Do not drink any water within one hour of the test. . Do not eat any food 4 hours prior to the test. . You may take your regular medications prior to the test.  . Take metoprolol (Lopressor) two hours prior to test.       After the Test: . Drink plenty of water. . After receiving IV contrast, you may experience a mild flushed feeling. This is normal. . On occasion, you may experience a mild rash up to 24 hours after the test. This is not dangerous. If this occurs, you can take Benadryl 25 mg and increase your fluid intake. . If you experience trouble breathing, this can be serious. If it is severe call 911 IMMEDIATELY. If it is mild, please call our office.    Once we have confirmed authorization from your insurance company, we will call you to set up a date and time for your test. Based on how quickly your insurance processes prior authorizations requests, please allow up to 4 weeks to be contacted for scheduling your Cardiac CT appointment. Be advised that routine Cardiac CT appointments could be scheduled as many as 8 weeks after your provider has ordered it.  For non-scheduling  related questions, please contact the cardiac imaging nurse navigator should you have any questions/concerns: Marchia Bond, Cardiac Imaging Nurse Navigator Burley Saver, Interim Cardiac Imaging Nurse [place] [facility] Direct Office Dial: [phone]   For scheduling needs, including cancellations and rescheduling, please call [place], [phone].    Follow-Up: At [facility], you and your health needs are our priority.  As  part of our continuing mission to provide you with exceptional heart care, we have created designated Provider Care Teams.  These Care Teams include your primary Cardiologist (physician) and Advanced Practice Providers (APPs -  Physician Assistants and Nurse Practitioners) who all work together to provide you with the care you need, when you need it.  We recommend signing up for the patient portal called "MyChart".  Sign up information is provided on this After Visit Summary.  MyChart is used to connect with patients for Virtual Visits (Telemedicine).  Patients are able to view lab/test results, encounter notes, upcoming appointments, etc.  Non-urgent messages can be sent to your provider as well.   To learn more about what you can do with MyChart, go to [email or web address].    Your next appointment:   5 weeks   The format for your next appointment:   In Person  Provider:   Kate Sable, MD   Other Instructions

## 2020-09-25 ENCOUNTER — Ambulatory Visit: Payer: BC Managed Care – PPO | Admitting: Family Medicine

## 2020-09-25 ENCOUNTER — Other Ambulatory Visit: Payer: Self-pay

## 2020-09-25 ENCOUNTER — Encounter: Payer: Self-pay | Admitting: Unknown Physician Specialty

## 2020-09-25 ENCOUNTER — Ambulatory Visit (INDEPENDENT_AMBULATORY_CARE_PROVIDER_SITE_OTHER): Payer: BC Managed Care – PPO | Admitting: Unknown Physician Specialty

## 2020-09-25 DIAGNOSIS — K219 Gastro-esophageal reflux disease without esophagitis: Secondary | ICD-10-CM

## 2020-09-25 DIAGNOSIS — I10 Essential (primary) hypertension: Secondary | ICD-10-CM | POA: Diagnosis not present

## 2020-09-25 MED ORDER — PANTOPRAZOLE SODIUM 40 MG PO TBEC
40.0000 mg | DELAYED_RELEASE_TABLET | Freq: Every day | ORAL | 1 refills | Status: DC
Start: 2020-09-25 — End: 2021-04-06

## 2020-09-25 MED ORDER — LISINOPRIL 5 MG PO TABS
5.0000 mg | ORAL_TABLET | Freq: Every day | ORAL | 1 refills | Status: DC
Start: 2020-09-25 — End: 2021-04-06

## 2020-09-25 NOTE — Progress Notes (Signed)
BP 107/66   Pulse 67   Temp 98.5 F (36.9 C) (Oral)   Ht 5' 10.98" (1.803 m)   Wt 275 lb (124.7 kg)   SpO2 96%   BMI 38.37 kg/m    Subjective:    Patient ID: Micheal Khan, male    DOB: 1961-08-02, 58 y.o.   MRN: [id number]  HPI: Micheal Khan is a 59 y.o. male  Chief Complaint  Patient presents with  . Hypertension  . Gastroesophageal Reflux   Hypertension Using medications without difficulty  Average home BPs SB{ 118-132  No problems or lightheadedness No chest pain with exertion or shortness of breath No Edema   The 10-year ASCVD risk score Micheal Khan., et al., 2013) is: 6.7%   Values used to calculate the score:     Age: 74 years     Sex: Male     Is Non-Hispanic African American: No     Diabetic: No     Tobacco smoker: No     Systolic Blood Pressure: 448 mmHg     Is BP treated: Yes     HDL Cholesterol: 40 mg/dL     Total Cholesterol: 195 mg/dL   GERD Takes Pantoprazole daily.  Controls symptoms.  Needs a refill.   Relevant past medical, surgical, family and social history reviewed and updated as indicated. Interim medical history since our last visit reviewed. Allergies and medications reviewed and updated.  Review of Systems  Per HPI unless specifically indicated above     Objective:    BP 107/66   Pulse 67   Temp 98.5 F (36.9 C) (Oral)   Ht 5' 10.98" (1.803 m)   Wt 275 lb (124.7 kg)   SpO2 96%   BMI 38.37 kg/m   Wt Readings from Last 3 Encounters:  09/25/20 275 lb (124.7 kg)  09/16/20 275 lb 4 oz (124.9 kg)  09/08/20 278 lb 2 oz (126.2 kg)    Physical Exam Constitutional:      General: He is not in acute distress.    Appearance: Normal appearance. He is well-developed and well-nourished.  HENT:     Head: Normocephalic and atraumatic.  Eyes:     General: Lids are normal. No scleral icterus.       Right eye: No discharge.        Left eye: No discharge.     Conjunctiva/sclera: Conjunctivae normal.  Neck:     Vascular: No  carotid bruit or JVD.  Cardiovascular:     Rate and Rhythm: Normal rate and regular rhythm.     Heart sounds: Normal heart sounds.  Pulmonary:     Effort: Pulmonary effort is normal. No respiratory distress.     Breath sounds: Normal breath sounds.  Abdominal:     Palpations: There is no hepatomegaly or splenomegaly.  Musculoskeletal:        General: Normal range of motion.     Cervical back: Normal range of motion and neck supple.  Skin:    General: Skin is warm, dry and intact.     Coloration: Skin is not pale.     Findings: No rash.  Neurological:     Mental Status: He is alert and oriented to person, place, and time.  Psychiatric:        Mood and Affect: Mood and affect normal.        Behavior: Behavior normal.        Thought Content: Thought content normal.  Judgment: Judgment normal.       Assessment & Plan:   Problem List Items Addressed This Visit      Unprioritized   Essential hypertension, benign    Stable, continue present medications.        Relevant Medications   lisinopril (ZESTRIL) 5 MG tablet   GERD (gastroesophageal reflux disease)    Unable to stop Pantoprazole.  Will continue daily use.  Rx refilled      Relevant Medications   pantoprazole (PROTONIX) 40 MG tablet       Follow up plan: Return in about 6 months (around 03/26/2021), or if symptoms worsen or fail to improve.

## 2020-09-25 NOTE — Assessment & Plan Note (Signed)
Unable to stop Pantoprazole.  Will continue daily use.  Rx refilled

## 2020-09-25 NOTE — Assessment & Plan Note (Signed)
Stable, continue present medications.   

## 2020-09-26 DIAGNOSIS — D2330 Other benign neoplasm of skin of unspecified part of face: Secondary | ICD-10-CM | POA: Diagnosis not present

## 2020-10-07 ENCOUNTER — Telehealth (HOSPITAL_COMMUNITY): Payer: Self-pay | Admitting: *Deleted

## 2020-10-07 NOTE — Telephone Encounter (Signed)

## 2020-10-09 ENCOUNTER — Other Ambulatory Visit: Payer: Self-pay

## 2020-10-09 ENCOUNTER — Ambulatory Visit
Admission: RE | Admit: 2020-10-09 | Discharge: 2020-10-09 | Disposition: A | Discharge status: Home / Self Care | Payer: BC Managed Care – PPO | Source: Ambulatory Visit | Attending: Cardiology | Admitting: Cardiology

## 2020-10-09 DIAGNOSIS — R072 Precordial pain: Secondary | ICD-10-CM

## 2020-10-09 LAB — POCT I-STAT CREATININE: Creatinine, Ser: 1.3 mg/dL — ABNORMAL HIGH (ref 0.61–1.24)

## 2020-10-09 MED ORDER — IOHEXOL 350 MG/ML SOLN
100.0000 mL | Freq: Once | INTRAVENOUS | Status: AC | PRN
  Administered 2020-10-09: 90 mL via INTRAVENOUS

## 2020-10-09 MED ORDER — NITROGLYCERIN 0.4 MG SL SUBL
0.8000 mg | SUBLINGUAL_TABLET | Freq: Once | SUBLINGUAL | Status: AC
  Administered 2020-10-09: 0.8 mg via SUBLINGUAL

## 2020-10-09 NOTE — Progress Notes (Signed)
Patient tolerated CT well. Drank water and coffee along with eating peanut butter crackers after. Vital signs stable encourage to drink water throughout day.Reasons explained and verbalized understanding. Ambulated steady gait.

## 2020-10-14 ENCOUNTER — Other Ambulatory Visit: Payer: Self-pay

## 2020-10-14 ENCOUNTER — Ambulatory Visit (INDEPENDENT_AMBULATORY_CARE_PROVIDER_SITE_OTHER): Payer: BC Managed Care – PPO

## 2020-10-14 DIAGNOSIS — R072 Precordial pain: Secondary | ICD-10-CM

## 2020-10-14 LAB — ECHOCARDIOGRAM COMPLETE
AR max vel: 4.73 cm2
AV Area VTI: 4.91 cm2
AV Area mean vel: 4.82 cm2
AV Mean grad: 4 mmHg
AV Peak grad: 7.2 mmHg
Ao pk vel: 1.34 m/s
Area-P 1/2: 2.43 cm2
Calc EF: 56.1 %
S' Lateral: 3.5 cm
Single Plane A2C EF: 56.5 %
Single Plane A4C EF: 55.5 %

## 2020-10-21 ENCOUNTER — Telehealth: Payer: Self-pay

## 2020-10-21 NOTE — Telephone Encounter (Signed)
The patient has been notified of the Echo result via VM per DPR on file. A Encouraged patient to call back with any questions or concerns.

## 2020-10-27 ENCOUNTER — Telehealth: Payer: Self-pay | Admitting: Cardiology

## 2020-10-27 NOTE — Telephone Encounter (Signed)
  Patient Consent for Virtual Visit         Micheal Khan has provided verbal consent on 10/27/2020 for a virtual visit (video or telephone).   CONSENT FOR VIRTUAL VISIT FOR:  Micheal Khan  By participating in this virtual visit I agree to the following:  I hereby voluntarily request, consent and authorize [facility] and its employed or contracted physicians, physician assistants, nurse practitioners or other licensed health care professionals (the Practitioner), to provide me with telemedicine health care services (the "Services") as deemed necessary by the treating Practitioner. I acknowledge and consent to receive the Services by the Practitioner via telemedicine. I understand that the telemedicine visit will involve communicating with the Practitioner through live audiovisual communication technology and the disclosure of certain medical information by electronic transmission. I acknowledge that I have been given the opportunity to request an in-person assessment or other available alternative prior to the telemedicine visit and am voluntarily participating in the telemedicine visit.  I understand that I have the right to withhold or withdraw my consent to the use of telemedicine in the course of my care at any time, without affecting my right to future care or treatment, and that the Practitioner or I may terminate the telemedicine visit at any time. I understand that I have the right to inspect all information obtained and/or recorded in the course of the telemedicine visit and may receive copies of available information for a reasonable fee.  I understand that some of the potential risks of receiving the Services via telemedicine include:  Marland Kitchen Delay or interruption in medical evaluation due to technological equipment failure or disruption; . Information transmitted may not be sufficient (e.g. poor resolution of images) to allow for appropriate medical decision making by the Practitioner;  and/or  . In rare instances, security protocols could fail, causing a breach of personal health information.  Furthermore, I acknowledge that it is my responsibility to provide information about my medical history, conditions and care that is complete and accurate to the best of my ability. I acknowledge that Practitioner's advice, recommendations, and/or decision may be based on factors not within their control, such as incomplete or inaccurate data provided by me or distortions of diagnostic images or specimens that may result from electronic transmissions. I understand that the practice of medicine is not an exact science and that Practitioner makes no warranties or guarantees regarding treatment outcomes. I acknowledge that a copy of this consent can be made available to me via my patient portal ([place]), or I can request a printed copy by calling the office of [facility].    I understand that my insurance will be billed for this visit.   I have read or had this consent read to me. . I understand the contents of this consent, which adequately explains the benefits and risks of the Services being provided via telemedicine.  . I have been provided ample opportunity to ask questions regarding this consent and the Services and have had my questions answered to my satisfaction. . I give my informed consent for the services to be provided through the use of telemedicine in my medical care

## 2020-10-28 ENCOUNTER — Encounter: Payer: Self-pay | Admitting: Cardiology

## 2020-10-28 ENCOUNTER — Telehealth (INDEPENDENT_AMBULATORY_CARE_PROVIDER_SITE_OTHER): Payer: BC Managed Care – PPO | Admitting: Cardiology

## 2020-10-28 ENCOUNTER — Other Ambulatory Visit: Payer: Self-pay

## 2020-10-28 VITALS — BP 116/77 | HR 71 | Ht 74.0 in | Wt 270.0 lb

## 2020-10-28 DIAGNOSIS — I10 Essential (primary) hypertension: Secondary | ICD-10-CM | POA: Diagnosis not present

## 2020-10-28 DIAGNOSIS — I77810 Thoracic aortic ectasia: Secondary | ICD-10-CM | POA: Diagnosis not present

## 2020-10-28 DIAGNOSIS — R072 Precordial pain: Secondary | ICD-10-CM

## 2020-10-28 NOTE — Patient Instructions (Addendum)
Medication Instructions:  Your physician recommends that you continue on your current medications as directed. Please refer to the Current Medication list given to you today.  *If you need a refill on your cardiac medications before your next appointment, please call your pharmacy*  Lab Work: none If you have labs (blood work) drawn today and your tests are completely normal, you will receive your results only by: Marland Kitchen MyChart Message (if you have MyChart) OR . A paper copy in the mail If you have any lab test that is abnormal or we need to change your treatment, we will call you to review the results.  Testing/Procedures: Your physician has requested that you have an echocardiogram IN ONE YEAR PRIOR TO [place] UP appointment. Echocardiography is a painless test that uses sound waves to create images of your heart. It provides your doctor with information about the size and shape of your heart and how well your heart's chambers and valves are working. This procedure takes approximately one hour. There are no restrictions for this procedure. There is a possibility that an IV may need to be started during your test to inject an image enhancing agent. This is done to obtain more optimal pictures of your heart. Therefore we ask that you do at least drink some water prior to coming in to hydrate your veins.   Follow-Up: At [facility], you and your health needs are our priority.  As part of our continuing mission to provide you with exceptional heart care, we have created designated Provider Care Teams.  These Care Teams include your primary Cardiologist (physician) and Advanced Practice Providers (APPs -  Physician Assistants and Nurse Practitioners) who all work together to provide you with the care you need, when you need it.  We recommend signing up for the patient portal called "MyChart".  Sign up information is provided on this After Visit Summary.  MyChart is used to connect with patients  for Virtual Visits (Telemedicine).  Patients are able to view lab/test results, encounter notes, upcoming appointments, etc.  Non-urgent messages can be sent to your provider as well.   To learn more about what you can do with MyChart, go to [email or web address].    Your next appointment:   12 month(s) after echo has been completed.    You will receive a reminder letter or phone call sometime closer to the 12 month follow up time. You may also want to mark your calendar to call our office to schedule echo and follow up appointment about 2 months prior to next year at this time.  The format for your next appointment:   In Person  Provider:   You may see Kate Sable, MD or one of the following Advanced Practice Providers on your designated Care Team:    Murray Hodgkins, NP  Christell Faith, PA-C  Marrianne Mood, PA-C  Cadence [place], [place]  Laurann [place], NP

## 2020-10-28 NOTE — Progress Notes (Signed)
Virtual Visit via Video Note   This visit type was conducted due to national recommendations for restrictions regarding the COVID-19 Pandemic (e.g. social distancing) in an effort to limit this patient's exposure and mitigate transmission in our community.  Due to his co-morbid illnesses, this patient is at least at moderate risk for complications without adequate follow up.  This format is felt to be most appropriate for this patient at this time.  All issues noted in this document were discussed and addressed.  A limited physical exam was performed with this format.  Please refer to the patient's chart for his consent to telehealth for [facility].      Date:  10/28/2020   ID:  Micheal Khan, DOB 01/08/1961, MRN [id number]  Patient Location: Home Provider Location: Office/Clinic  PCP:  Volney American, PA-C  Cardiologist:  Kate Sable, MD  Electrophysiologist:  None   Evaluation Performed:  Follow-Up Visit  Chief Complaint: Follow-up testing results  History of Present Illness:    Micheal Khan is a 61 y.o. male with history of hypertension, who presents for follow-up. Last seen due to chest discomfort lasting over 2 months. Echo and coronary CTA was ordered to evaluate patient's presence of cardiac pathology, especially in light of patient having early CAD in family with father dying from heart attack in his 69s.  Patient states feeling well since testing.  Denies chest pain or shortness of breath.  Takes medicines for blood pressure as prescribed.  The patient does not have symptoms concerning for COVID-19 infection (fever, chills, cough, or new shortness of breath).    Past Medical History:  Diagnosis Date  . Cancer ([place]) 07/2004   testicular-surgery and chemo  . Family history of breast cancer   . Family history of colon cancer   . Family history of melanoma   . GERD (gastroesophageal reflux disease)   . Hypertension    Past Surgical History:   Procedure Laterality Date  . COLONOSCOPY WITH PROPOFOL    . COLONOSCOPY WITH PROPOFOL N/A 01/08/2020   Procedure: COLONOSCOPY WITH PROPOFOL;  Surgeon: Jonathon Bellows, MD;  Location: [facility] ENDOSCOPY;  Service: Gastroenterology;  Laterality: N/A;  . ESOPHAGOGASTRODUODENOSCOPY (EGD) WITH PROPOFOL    . TESTICLE REMOVAL Left 07/2004     Current Meds  Medication Sig  . cholecalciferol (VITAMIN D) 1000 units tablet Take 1,000 Units by mouth daily.  . Cyanocobalamin (VITAMIN B 12 PO) Take by mouth daily.  . famotidine (PEPCID) 20 MG tablet Take 1 tablet (20 mg total) by mouth daily.  Marland Kitchen GARLIC OIL PO Take by mouth daily. 1500 mg  . lisinopril (ZESTRIL) 5 MG tablet Take 1 tablet (5 mg total) by mouth daily.  . Multiple Vitamin (ONE-A-DAY MENS PO) Take 1 tablet by mouth daily.  . pantoprazole (PROTONIX) 40 MG tablet Take 1 tablet (40 mg total) by mouth daily.  . Probiotic Product (PROBIOTIC DAILY PO) Take by mouth daily.  Marland Kitchen UNABLE TO FIND Apple cider vinagre. Liquid. One table spoon a day     Allergies:   Patient has no known allergies.   Social History   Tobacco Use  . Smoking status: Never Smoker  . Smokeless tobacco: Never Used  Vaping Use  . Vaping Use: Never used  Substance Use Topics  . Alcohol use: No    Alcohol/week: 0.0 standard drinks  . Drug use: No     Family Hx: The patient's family history includes Breast cancer (age of onset: 36) in his maternal  aunt; Cancer in his maternal grandfather; Colon cancer (age of onset: 59) in his brother; Colon polyps in his paternal uncle; Heart disease in his father and paternal grandfather; Melanoma (age of onset: 68) in his mother; Stroke in his paternal grandmother.  ROS:   Please see the history of present illness.     All other systems reviewed and are negative.   Prior CV studies:   The following studies were reviewed today:    Labs/Other Tests and Data Reviewed:    EKG:  No ECG reviewed.  Recent Labs: 03/26/2020: ALT 18; BUN  17; Hemoglobin 13.5; Platelets 215; Potassium 4.4; Sodium 143; TSH 2.390 10/09/2020: Creatinine, Ser 1.30   Recent Lipid Panel Lab Results  Component Value Date/Time   CHOL 195 03/26/2020 02:26 PM   TRIG 241 (H) 03/26/2020 02:26 PM   HDL 40 03/26/2020 02:26 PM   LDLCALC 113 (H) 03/26/2020 02:26 PM    Wt Readings from Last 3 Encounters:  10/28/20 270 lb (122.5 kg)  09/25/20 275 lb (124.7 kg)  09/16/20 275 lb 4 oz (124.9 kg)     Objective:    Vital Signs:  BP 116/77 (BP Location: Left Arm, Patient Position: Sitting, Cuff Size: Normal)   Pulse 71   Ht 6\' 2"  (1.88 m)   Wt 270 lb (122.5 kg)   BMI 34.67 kg/m    VITAL SIGNS:  reviewed GEN:  no acute distress  ASSESSMENT & PLAN:    1. History of chest pain, risk factors hypertension, family history of CAD.  Currently denies symptoms.  Echocardiogram showed normal systolic and diastolic function, EF 55 to 60% moderate ascending aorta dilatation measuring 45 mm. Coronary CTA showed calcium score of zero, no evidence of CAD. Ascending aorta measures 4.3 cm.  No findings to suggest etiology of chest pain. 2. Hypertension, BP controlled. Continue lisinopril. 3. Mild to moderate ascending aorta dilatation.  45 mm on echo, 4.3 mm on CT.  Monitor with serial echocardiograms yearly as per [facility] AHA guidelines.  Follow-up in 1 year after repeat echo.  COVID-19 Education: The signs and symptoms of COVID-19 were discussed with the patient and how to seek care for testing (follow up with PCP or arrange E-visit).  The importance of social distancing was discussed today.  Time:   Today, I have spent 40 minutes with the patient with telehealth technology discussing the above problems.     Medication Adjustments/Labs and Tests Ordered: Current medicines are reviewed at length with the patient today.  Concerns regarding medicines are outlined above.   Tests Ordered: Orders Placed This Encounter  Procedures  . ECHOCARDIOGRAM COMPLETE     Medication Changes: No orders of the defined types were placed in this encounter.   Follow Up:  In Person in 1 year(s)  Signed, Kate Sable, MD  10/28/2020 10:08 AM    [place]

## 2020-11-03 ENCOUNTER — Other Ambulatory Visit: Payer: Self-pay | Admitting: Nurse Practitioner

## 2020-11-03 DIAGNOSIS — K219 Gastro-esophageal reflux disease without esophagitis: Secondary | ICD-10-CM

## 2020-12-05 ENCOUNTER — Telehealth (INDEPENDENT_AMBULATORY_CARE_PROVIDER_SITE_OTHER): Payer: Self-pay | Admitting: Gastroenterology

## 2020-12-05 ENCOUNTER — Other Ambulatory Visit: Payer: Self-pay

## 2020-12-05 DIAGNOSIS — Z86010 Personal history of colonic polyps: Secondary | ICD-10-CM

## 2020-12-05 DIAGNOSIS — Z800 Family history of malignant neoplasm of digestive organs: Secondary | ICD-10-CM

## 2020-12-05 MED ORDER — NA SULFATE-K SULFATE-MG SULF 17.5-3.13-1.6 GM/177ML PO SOLN: 1.0000 | Freq: Once | ORAL | 0 refills | Status: AC

## 2020-12-05 NOTE — Progress Notes (Signed)
Gastroenterology Pre-Procedure Review  Request Date: Friday 01/16/21 Requesting Physician: Dr. Vicente Males  PATIENT REVIEW QUESTIONS: The patient responded to the following health history questions as indicated:    1. Are you having any GI issues? no 2. Do you have a personal history of Polyps? yes (01/08/20 colonoscopy performed by Dr. Vicente Males) 3. Do you have a family history of Colon Cancer or Polyps? yes (brother colon cancer) 4. Diabetes Mellitus? no 5. Joint replacements in the past 12 months?no 6. Major health problems in the past 3 months?no 7. Any artificial heart valves, MVP, or defibrillator?no    MEDICATIONS & ALLERGIES:    Patient reports the following regarding taking any anticoagulation/antiplatelet therapy:   Plavix, Coumadin, Eliquis, Xarelto, Lovenox, Pradaxa, Brilinta, or Effient? no Aspirin? no  Patient confirms/reports the following medications:  Current Outpatient Medications  Medication Sig Dispense Refill  . cholecalciferol (VITAMIN D) 1000 units tablet Take 1,000 Units by mouth daily.    . Cyanocobalamin (VITAMIN B 12 PO) Take by mouth daily.    . famotidine (PEPCID) 20 MG tablet Take 1 tablet (20 mg total) by mouth daily. 60 tablet 0  . GARLIC OIL PO Take by mouth daily. 1500 mg    . lisinopril (ZESTRIL) 5 MG tablet Take 1 tablet (5 mg total) by mouth daily. 90 tablet 1  . Multiple Vitamin (ONE-A-DAY MENS PO) Take 1 tablet by mouth daily.    . Na Sulfate-K Sulfate-Mg Sulf 17.5-3.13-1.6 GM/177ML SOLN Take 1 kit by mouth once for 1 dose. 354 mL 0  . pantoprazole (PROTONIX) 40 MG tablet Take 1 tablet (40 mg total) by mouth daily. 90 tablet 1  . Probiotic Product (PROBIOTIC DAILY PO) Take by mouth daily.    Marland Kitchen UNABLE TO FIND Apple cider vinagre. Liquid. One table spoon a day     No current facility-administered medications for this visit.    Patient confirms/reports the following allergies:  No Known Allergies  No orders of the defined types were placed in this  encounter.   AUTHORIZATION INFORMATION Primary Insurance: 1D#: Group #:  Secondary Insurance: 1D#: Group #:  SCHEDULE INFORMATION: Date: 01/16/21 Time: Location:[facility]

## 2021-01-05 ENCOUNTER — Other Ambulatory Visit: Payer: Self-pay

## 2021-01-05 DIAGNOSIS — Z86010 Personal history of colonic polyps: Secondary | ICD-10-CM

## 2021-01-05 MED ORDER — GOLYTELY 236 G PO SOLR: 4000.0000 mL | Freq: Once | ORAL | 0 refills | Status: AC

## 2021-01-14 ENCOUNTER — Other Ambulatory Visit: Payer: Self-pay

## 2021-01-14 ENCOUNTER — Other Ambulatory Visit
Admission: RE | Admit: 2021-01-14 | Discharge: 2021-01-14 | Disposition: A | Discharge status: Home / Self Care | Payer: BC Managed Care – PPO | Source: Ambulatory Visit | Attending: Gastroenterology | Admitting: Gastroenterology

## 2021-01-14 DIAGNOSIS — Z8249 Family history of ischemic heart disease and other diseases of the circulatory system: Secondary | ICD-10-CM | POA: Diagnosis not present

## 2021-01-14 DIAGNOSIS — Z808 Family history of malignant neoplasm of other organs or systems: Secondary | ICD-10-CM | POA: Diagnosis not present

## 2021-01-14 DIAGNOSIS — I10 Essential (primary) hypertension: Secondary | ICD-10-CM | POA: Diagnosis not present

## 2021-01-14 DIAGNOSIS — Z8371 Family history of colonic polyps: Secondary | ICD-10-CM | POA: Diagnosis not present

## 2021-01-14 DIAGNOSIS — Z803 Family history of malignant neoplasm of breast: Secondary | ICD-10-CM | POA: Diagnosis not present

## 2021-01-14 DIAGNOSIS — D122 Benign neoplasm of ascending colon: Secondary | ICD-10-CM | POA: Diagnosis not present

## 2021-01-14 DIAGNOSIS — Z800 Family history of malignant neoplasm of digestive organs: Secondary | ICD-10-CM | POA: Diagnosis not present

## 2021-01-14 DIAGNOSIS — K219 Gastro-esophageal reflux disease without esophagitis: Secondary | ICD-10-CM | POA: Diagnosis not present

## 2021-01-14 DIAGNOSIS — Z8546 Personal history of malignant neoplasm of prostate: Secondary | ICD-10-CM | POA: Diagnosis not present

## 2021-01-14 DIAGNOSIS — Z20822 Contact with and (suspected) exposure to covid-19: Secondary | ICD-10-CM | POA: Insufficient documentation

## 2021-01-14 DIAGNOSIS — Z01812 Encounter for preprocedural laboratory examination: Secondary | ICD-10-CM | POA: Insufficient documentation

## 2021-01-14 DIAGNOSIS — Z1211 Encounter for screening for malignant neoplasm of colon: Secondary | ICD-10-CM | POA: Diagnosis not present

## 2021-01-14 DIAGNOSIS — K5730 Diverticulosis of large intestine without perforation or abscess without bleeding: Secondary | ICD-10-CM | POA: Diagnosis not present

## 2021-01-14 DIAGNOSIS — Z9221 Personal history of antineoplastic chemotherapy: Secondary | ICD-10-CM | POA: Diagnosis not present

## 2021-01-14 DIAGNOSIS — D124 Benign neoplasm of descending colon: Secondary | ICD-10-CM | POA: Diagnosis not present

## 2021-01-14 DIAGNOSIS — Z823 Family history of stroke: Secondary | ICD-10-CM | POA: Diagnosis not present

## 2021-01-15 LAB — SARS CORONAVIRUS 2 (TAT 6-24 HRS): SARS Coronavirus 2: NEGATIVE

## 2021-01-16 ENCOUNTER — Encounter
Admission: RE | Disposition: A | Discharge status: Home / Self Care | Payer: Self-pay | Source: Home / Self Care | Attending: Gastroenterology

## 2021-01-16 ENCOUNTER — Ambulatory Visit: Payer: BC Managed Care – PPO | Admitting: Certified Registered Nurse Anesthetist

## 2021-01-16 ENCOUNTER — Other Ambulatory Visit: Payer: Self-pay

## 2021-01-16 ENCOUNTER — Encounter: Payer: Self-pay | Admitting: Gastroenterology

## 2021-01-16 ENCOUNTER — Ambulatory Visit
Admission: RE | Admit: 2021-01-16 | Discharge: 2021-01-16 | Disposition: A | Discharge status: Home / Self Care | Payer: BC Managed Care – PPO | Attending: Gastroenterology | Admitting: Gastroenterology

## 2021-01-16 DIAGNOSIS — Z8249 Family history of ischemic heart disease and other diseases of the circulatory system: Secondary | ICD-10-CM | POA: Diagnosis not present

## 2021-01-16 DIAGNOSIS — K5730 Diverticulosis of large intestine without perforation or abscess without bleeding: Secondary | ICD-10-CM | POA: Diagnosis not present

## 2021-01-16 DIAGNOSIS — Z9221 Personal history of antineoplastic chemotherapy: Secondary | ICD-10-CM | POA: Diagnosis not present

## 2021-01-16 DIAGNOSIS — Z808 Family history of malignant neoplasm of other organs or systems: Secondary | ICD-10-CM | POA: Insufficient documentation

## 2021-01-16 DIAGNOSIS — Z800 Family history of malignant neoplasm of digestive organs: Secondary | ICD-10-CM | POA: Insufficient documentation

## 2021-01-16 DIAGNOSIS — K635 Polyp of colon: Secondary | ICD-10-CM

## 2021-01-16 DIAGNOSIS — Z86010 Personal history of colonic polyps: Secondary | ICD-10-CM | POA: Diagnosis not present

## 2021-01-16 DIAGNOSIS — Z803 Family history of malignant neoplasm of breast: Secondary | ICD-10-CM | POA: Insufficient documentation

## 2021-01-16 DIAGNOSIS — D124 Benign neoplasm of descending colon: Secondary | ICD-10-CM | POA: Diagnosis not present

## 2021-01-16 DIAGNOSIS — Z8546 Personal history of malignant neoplasm of prostate: Secondary | ICD-10-CM | POA: Insufficient documentation

## 2021-01-16 DIAGNOSIS — Z8371 Family history of colonic polyps: Secondary | ICD-10-CM | POA: Diagnosis not present

## 2021-01-16 DIAGNOSIS — K219 Gastro-esophageal reflux disease without esophagitis: Secondary | ICD-10-CM | POA: Insufficient documentation

## 2021-01-16 DIAGNOSIS — D122 Benign neoplasm of ascending colon: Secondary | ICD-10-CM | POA: Diagnosis not present

## 2021-01-16 DIAGNOSIS — Z823 Family history of stroke: Secondary | ICD-10-CM | POA: Diagnosis not present

## 2021-01-16 DIAGNOSIS — Z1211 Encounter for screening for malignant neoplasm of colon: Secondary | ICD-10-CM | POA: Insufficient documentation

## 2021-01-16 DIAGNOSIS — K5790 Diverticulosis of intestine, part unspecified, without perforation or abscess without bleeding: Secondary | ICD-10-CM | POA: Diagnosis not present

## 2021-01-16 DIAGNOSIS — Z20822 Contact with and (suspected) exposure to covid-19: Secondary | ICD-10-CM | POA: Insufficient documentation

## 2021-01-16 DIAGNOSIS — I10 Essential (primary) hypertension: Secondary | ICD-10-CM | POA: Diagnosis not present

## 2021-01-16 HISTORY — PX: COLONOSCOPY WITH PROPOFOL: SHX5780

## 2021-01-16 HISTORY — DX: Other specified postprocedural states: R11.2

## 2021-01-16 HISTORY — DX: Other complications of anesthesia, initial encounter: T88.59XA

## 2021-01-16 HISTORY — DX: Other specified postprocedural states: Z98.890

## 2021-01-16 SURGERY — COLONOSCOPY WITH PROPOFOL
Anesthesia: General

## 2021-01-16 MED ORDER — SODIUM CHLORIDE 0.9 % IV SOLN: INTRAVENOUS | Status: DC

## 2021-01-16 MED ORDER — GLYCOPYRROLATE 0.2 MG/ML IJ SOLN
INTRAMUSCULAR | Status: AC
  Filled 2021-01-16: qty 1

## 2021-01-16 MED ORDER — LIDOCAINE HCL (PF) 2 % IJ SOLN
INTRAMUSCULAR | Status: AC
  Filled 2021-01-16: qty 5

## 2021-01-16 MED ORDER — PROPOFOL 10 MG/ML IV BOLUS
INTRAVENOUS | Status: DC | PRN
  Administered 2021-01-16: 70 mg via INTRAVENOUS
  Administered 2021-01-16 (×2): 30 mg via INTRAVENOUS
  Administered 2021-01-16: 20 mg via INTRAVENOUS

## 2021-01-16 MED ORDER — PROPOFOL 500 MG/50ML IV EMUL
INTRAVENOUS | Status: AC
  Filled 2021-01-16: qty 100

## 2021-01-16 MED ORDER — ONDANSETRON HCL 4 MG/2ML IJ SOLN
INTRAMUSCULAR | Status: DC | PRN
  Administered 2021-01-16: 4 mg via INTRAVENOUS

## 2021-01-16 MED ORDER — PROPOFOL 500 MG/50ML IV EMUL
INTRAVENOUS | Status: DC | PRN
  Administered 2021-01-16: 160 ug/kg/min via INTRAVENOUS

## 2021-01-16 NOTE — Anesthesia Preprocedure Evaluation (Addendum)
Anesthesia Evaluation   Patient awake    Reviewed: Allergy & Precautions, NPO status , Patient's Chart, lab work & pertinent test results  History of Anesthesia Complications (+) PONV and history of anesthetic complications  Airway Mallampati: II  TM Distance: >3 FB Neck ROM: Full    Dental no notable dental hx.    Pulmonary neg pulmonary ROS, neg sleep apnea, neg COPD,    breath sounds clear to auscultation- rhonchi (-) wheezing      Cardiovascular hypertension, Pt. on medications (-) CAD, (-) Past MI, (-) Cardiac Stents and (-) CABG  Rhythm:Regular Rate:Normal - Systolic murmurs and - Diastolic murmurs    Neuro/Psych neg Seizures PSYCHIATRIC DISORDERS Anxiety negative neurological ROS     GI/Hepatic Neg liver ROS, GERD  ,  Endo/Other  negative endocrine ROSneg diabetes  Renal/GU negative Renal ROS     Musculoskeletal negative musculoskeletal ROS (+)   Abdominal (+) + obese,   Peds  Hematology negative hematology ROS (+)   Anesthesia Other Findings Past Medical History: 07/2004: Cancer ([place])     Comment:  testicular-surgery and chemo No date: Complication of anesthesia     Comment:  nausea with colonoscopy in [place] 9 years ago No date: Family history of breast cancer No date: Family history of colon cancer No date: Family history of melanoma No date: GERD (gastroesophageal reflux disease) No date: Hypertension No date: PONV (postoperative nausea and vomiting)   Reproductive/Obstetrics                            Anesthesia Physical Anesthesia Plan  ASA: II  Anesthesia Plan: General   Post-op Pain Management:    Induction: Intravenous  PONV Risk Score and Plan: 2 and Propofol infusion  Airway Management Planned: Natural Airway  Additional Equipment:   Intra-op Plan:   Post-operative Plan:   Informed Consent: I have reviewed the patients History and Physical,  chart, labs and discussed the procedure including the risks, benefits and alternatives for the proposed anesthesia with the patient or authorized representative who has indicated his/her understanding and acceptance.     Dental advisory given  Plan Discussed with: CRNA and Anesthesiologist  Anesthesia Plan Comments:         Anesthesia Quick Evaluation

## 2021-01-16 NOTE — Transfer of Care (Signed)
Immediate Anesthesia Transfer of Care Note  Patient: Micheal Khan  Procedure(s) Performed: COLONOSCOPY WITH PROPOFOL (N/A )  Patient Location: PACU  Anesthesia Type:General  Level of Consciousness: sedated  Airway & Oxygen Therapy: Patient Spontanous Breathing  Post-op Assessment: Report given to RN and Post -op Vital signs reviewed and stable  Post vital signs: Reviewed and stable  Last Vitals:  Vitals Value Taken Time  BP 102/66 01/16/21 0811  Temp    Pulse 63 01/16/21 0812  Resp 15 01/16/21 0812  SpO2 97 % 01/16/21 0812  Vitals shown include unvalidated device data.  Last Pain:  Vitals:   01/16/21 0811  TempSrc:   PainSc: 0-No pain         Complications: No complications documented.

## 2021-01-16 NOTE — Anesthesia Postprocedure Evaluation (Signed)
Anesthesia Post Note  Patient: Micheal Khan  Procedure(s) Performed: COLONOSCOPY WITH PROPOFOL (N/A )  Patient location during evaluation: Endoscopy Anesthesia Type: General Level of consciousness: awake and alert and oriented Pain management: pain level controlled Vital Signs Assessment: post-procedure vital signs reviewed and stable Respiratory status: spontaneous breathing, nonlabored ventilation and respiratory function stable Cardiovascular status: blood pressure returned to baseline and stable Postop Assessment: no signs of nausea or vomiting Anesthetic complications: no   No complications documented.   Last Vitals:  Vitals:   01/16/21 0821 01/16/21 0831  BP: 111/81 116/79  Pulse: (!) 56 (!) 51  Resp: 15 (!) 8  Temp:    SpO2: 96% 98%    Last Pain:  Vitals:   01/16/21 0831  TempSrc:   PainSc: 0-No pain                 Ingra Rother

## 2021-01-16 NOTE — Progress Notes (Signed)
No risk at this time. 

## 2021-01-16 NOTE — H&P (Signed)
Jonathon Bellows, MD [address] [place], [place], [place], [address] [address], [place], [place], [place], [address] Phone: [phone]  Fax: [phone]  Primary Care Physician:  Guadalupe Maple, MD   Pre-Procedure History & Physical: HPI:  Micheal Khan is a 60 y.o. male is here for an colonoscopy.   Past Medical History:  Diagnosis Date  . Cancer ([place]) 07/2004   testicular-surgery and chemo  . Complication of anesthesia    nausea with colonoscopy in [facility] 9 years ago  . Family history of breast cancer   . Family history of colon cancer   . Family history of melanoma   . GERD (gastroesophageal reflux disease)   . Hypertension   . PONV (postoperative nausea and vomiting)     Past Surgical History:  Procedure Laterality Date  . COLONOSCOPY WITH PROPOFOL    . COLONOSCOPY WITH PROPOFOL N/A 01/08/2020   Procedure: COLONOSCOPY WITH PROPOFOL;  Surgeon: Jonathon Bellows, MD;  Location: [facility] ENDOSCOPY;  Service: Gastroenterology;  Laterality: N/A;  . ESOPHAGOGASTRODUODENOSCOPY (EGD) WITH PROPOFOL    . TESTICLE REMOVAL Left 07/2004    Prior to Admission medications   Medication Sig Start Date End Date Taking? Authorizing Provider  GARLIC OIL PO Take by mouth daily. 1500 mg   Yes [provider]  lisinopril (ZESTRIL) 5 MG tablet Take 1 tablet (5 mg total) by mouth daily. 09/25/20  Yes Kathrine Haddock, NP  cholecalciferol (VITAMIN D) 1000 units tablet Take 1,000 Units by mouth daily.    [provider]  Cyanocobalamin (VITAMIN B 12 PO) Take by mouth daily.    [provider]  famotidine (PEPCID) 20 MG tablet Take 1 tablet (20 mg total) by mouth daily. 11/03/20   Kathrine Haddock, NP  Multiple Vitamin (ONE-A-DAY MENS PO) Take 1 tablet by mouth daily.    [provider]  pantoprazole (PROTONIX) 40 MG tablet Take 1 tablet (40 mg total) by mouth daily. 09/25/20   Kathrine Haddock, NP  Probiotic Product (PROBIOTIC DAILY PO) Take by mouth daily.     [provider]  UNABLE TO FIND Apple cider vinagre. Liquid. One table spoon a day    [provider]    Allergies as of 12/05/2020  . (No Known Allergies)    Family History  Problem Relation Age of Onset  . Melanoma Mother 88       metastatic to brain  . Heart disease Father        MI  . Colon cancer Brother 49  . Cancer Maternal Grandfather        unknown type, maybe brain tumor  . Stroke Paternal Grandmother   . Heart disease Paternal Grandfather   . Breast cancer Maternal Aunt 73  . Colon polyps Paternal Uncle        less than 10 cumulative    Social History   Socioeconomic History  . Marital status: Married    Spouse name: Not on file  . Number of children: Not on file  . Years of education: Not on file  . Highest education level: Not on file  Occupational History  . Not on file  Tobacco Use  . Smoking status: Never Smoker  . Smokeless tobacco: Never Used  Vaping Use  . Vaping Use: Never used  Substance and Sexual Activity  . Alcohol use: No    Alcohol/week: 0.0 standard drinks  . Drug use: No  . Sexual activity: Yes  Other Topics Concern  . Not on  file  Social History Narrative  . Not on file   Social Determinants of Health   Financial Resource Strain: Not on file  Food Insecurity: Not on file  Transportation Needs: Not on file  Physical Activity: Not on file  Stress: Not on file  Social Connections: Not on file  Intimate Partner Violence: Not on file    Review of Systems: See HPI, otherwise negative ROS  Physical Exam: BP 131/81   Pulse 60   Temp (!) 96 F (35.6 C) (Temporal)   Resp 16   Ht 6\' 2"  (1.88 m)   Wt 121.6 kg   SpO2 97%   BMI 34.41 kg/m  General:   Alert,  pleasant and cooperative in NAD Head:  Normocephalic and atraumatic. Neck:  Supple; no masses or thyromegaly. Lungs:  Clear throughout to auscultation, normal respiratory effort.    Heart:  +S1, +S2, Regular rate and rhythm, No edema. Abdomen:  Soft,  nontender and nondistended. Normal bowel sounds, without guarding, and without rebound.   Neurologic:  Alert and  oriented x4;  grossly normal neurologically.  Impression/Plan: Micheal Khan is here for an colonoscopy to be performed for surveillance due to prior history of colon polyps   Risks, benefits, limitations, and alternatives regarding  colonoscopy have been reviewed with the patient.  Questions have been answered.  All parties agreeable.   Jonathon Bellows, MD  01/16/2021, 7:43 AM

## 2021-01-16 NOTE — Anesthesia Procedure Notes (Signed)
Performed by: Demetrius Charity, CRNA Patient Re-evaluated:Patient Re-evaluated prior to induction Oxygen Delivery Method: Nasal cannula Placement Confirmation: positive ETCO2 and CO2 detector

## 2021-01-16 NOTE — Op Note (Signed)
[facility] Gastroenterology Patient Name: Micheal Khan Procedure Date: 01/16/2021 7:29 AM MRN: [id number] Account #: [id number] Date of Birth: 1961-09-29 Admit Type: Outpatient Age: 60 Room: [facility] ENDO ROOM 4 Gender: Male Note Status: Finalized Procedure:             Colonoscopy Indications:           High risk colon cancer surveillance: Personal history                         of adenoma (10 mm or greater in size), Last                         colonoscopy: March 2021 Providers:             Jonathon Bellows MD, MD Medicines:             Monitored Anesthesia Care Complications:         No immediate complications. Procedure:             Pre-Anesthesia Assessment:                        - Prior to the procedure, a History and Physical was                         performed, and patient medications, allergies and                         sensitivities were reviewed. The patient's tolerance                         of previous anesthesia was reviewed.                        - The risks and benefits of the procedure and the                         sedation options and risks were discussed with the                         patient. All questions were answered and informed                         consent was obtained.                        - ASA Grade Assessment: II - A patient with mild                         systemic disease.                        After obtaining informed consent, the colonoscope was                         passed under direct vision. Throughout the procedure,                         the patient's blood pressure, pulse, and oxygen  saturations were monitored continuously. The                         Colonoscope was introduced through the anus and                         advanced to the the cecum, identified by the                         appendiceal orifice. The colonoscopy was performed                         with ease. The patient  tolerated the procedure well.                         The quality of the bowel preparation was excellent. Findings:      The perianal and digital rectal examinations were normal.      Multiple small-mouthed diverticula were found in the sigmoid colon.      A 10 mm polyp was found in the proximal ascending colon. The polyp was       semi-pedunculated. The polyp was removed with a hot snare. Resection and       retrieval were complete. To prevent bleeding after the polypectomy, one       hemostatic clip was successfully placed. There was no bleeding during,       or at the end, of the procedure.      A 3 mm polyp was found in the descending colon. The polyp was sessile.       The polyp was removed with a jumbo cold forceps. Resection and retrieval       were complete.      The exam was otherwise without abnormality on direct and retroflexion       views. Impression:            - Diverticulosis in the sigmoid colon.                        - One 10 mm polyp in the proximal ascending colon,                         removed with a hot snare. Resected and retrieved. Clip                         was placed.                        - One 3 mm polyp in the descending colon, removed with                         a jumbo cold forceps. Resected and retrieved.                        - The examination was otherwise normal on direct and                         retroflexion views. Recommendation:        - Discharge patient to home (with escort).                        -  Resume previous diet.                        - Continue present medications.                        - Await pathology results.                        - Repeat colonoscopy in 3 years for surveillance. Procedure Code(s):     --- Professional ---                        [phone], Colonoscopy, flexible; with removal of                         tumor(s), polyp(s), or other lesion(s) by snare                         technique                         45380, 5, Colonoscopy, flexible; with biopsy, single                         or multiple Diagnosis Code(s):     --- Professional ---                        Z86.010, Personal history of colonic polyps                        K63.5, Polyp of colon                        K57.30, Diverticulosis of large intestine without                         perforation or abscess without bleeding CPT copyright 2019 American Medical Association. All rights reserved. The codes documented in this report are preliminary and upon coder review may  be revised to meet current compliance requirements. Jonathon Bellows, MD Jonathon Bellows MD, MD 01/16/2021 8:11:12 AM This report has been signed electronically. Number of Addenda: 0 Note Initiated On: 01/16/2021 7:29 AM Scope Withdrawal Time: 0 hours 15 minutes 3 seconds  Total Procedure Duration: 0 hours 20 minutes 49 seconds  Estimated Blood Loss:  Estimated blood loss: none.      [facility]

## 2021-01-17 NOTE — Progress Notes (Signed)
Voicemail. No message left.

## 2021-01-19 ENCOUNTER — Encounter: Payer: Self-pay | Admitting: Gastroenterology

## 2021-01-19 LAB — SURGICAL PATHOLOGY

## 2021-02-03 ENCOUNTER — Encounter: Payer: Self-pay | Admitting: Gastroenterology

## 2021-03-27 ENCOUNTER — Encounter: Payer: BC Managed Care – PPO | Admitting: Internal Medicine

## 2021-04-02 DIAGNOSIS — L821 Other seborrheic keratosis: Secondary | ICD-10-CM | POA: Diagnosis not present

## 2021-04-02 DIAGNOSIS — Z872 Personal history of diseases of the skin and subcutaneous tissue: Secondary | ICD-10-CM | POA: Diagnosis not present

## 2021-04-02 DIAGNOSIS — L570 Actinic keratosis: Secondary | ICD-10-CM | POA: Diagnosis not present

## 2021-04-02 DIAGNOSIS — Z86018 Personal history of other benign neoplasm: Secondary | ICD-10-CM | POA: Diagnosis not present

## 2021-04-02 DIAGNOSIS — L578 Other skin changes due to chronic exposure to nonionizing radiation: Secondary | ICD-10-CM | POA: Diagnosis not present

## 2021-04-06 ENCOUNTER — Ambulatory Visit (INDEPENDENT_AMBULATORY_CARE_PROVIDER_SITE_OTHER): Payer: BC Managed Care – PPO | Admitting: Internal Medicine

## 2021-04-06 ENCOUNTER — Other Ambulatory Visit: Payer: Self-pay

## 2021-04-06 ENCOUNTER — Encounter: Payer: Self-pay | Admitting: Internal Medicine

## 2021-04-06 VITALS — BP 126/76 | HR 60 | Temp 98.7°F | Ht 74.02 in | Wt 274.2 lb

## 2021-04-06 DIAGNOSIS — I10 Essential (primary) hypertension: Secondary | ICD-10-CM

## 2021-04-06 LAB — URINALYSIS, ROUTINE W REFLEX MICROSCOPIC
Bilirubin, UA: NEGATIVE
Glucose, UA: NEGATIVE
Ketones, UA: NEGATIVE
Leukocytes,UA: NEGATIVE
Nitrite, UA: NEGATIVE
Protein,UA: NEGATIVE
RBC, UA: NEGATIVE
Specific Gravity, UA: 1.02 (ref 1.005–1.030)
Urobilinogen, Ur: 0.2 mg/dL (ref 0.2–1.0)
pH, UA: 6.5 (ref 5.0–7.5)

## 2021-04-06 MED ORDER — LISINOPRIL 5 MG PO TABS: 5.0000 mg | ORAL_TABLET | Freq: Every day | ORAL | 1 refills | Status: DC

## 2021-04-06 NOTE — Progress Notes (Signed)
BP 126/76   Pulse 60   Temp 98.7 F (37.1 C) (Oral)   Ht 6' 2.02" (1.88 m)   Wt 274 lb 3.2 oz (124.4 kg)   SpO2 95%   BMI 35.19 kg/m    Subjective:    Patient ID: Micheal Khan, male    DOB: [birth year], 60 y.o.   MRN: [id number]  Chief Complaint  Patient presents with   New Patient (Initial Visit)    HPI: Micheal Khan is a 60 y.o. male  HPI  Chief Complaint  Patient presents with   New Patient (Initial Visit)    Relevant past medical, surgical, family and social history reviewed and updated as indicated. Interim medical history since our last visit reviewed. Allergies and medications reviewed and updated.  Review of Systems  Constitutional:  Negative for activity change, appetite change, chills, fatigue and fever.  HENT:  Negative for congestion, ear discharge, ear pain and facial swelling.   Eyes:  Negative for pain, discharge and itching.  Respiratory:  Negative for cough, chest tightness, shortness of breath and wheezing.   Cardiovascular:  Negative for chest pain, palpitations and leg swelling.  Gastrointestinal:  Negative for abdominal distention, abdominal pain, blood in stool, constipation, diarrhea, nausea and vomiting.  Endocrine: Negative for cold intolerance, heat intolerance, polydipsia, polyphagia and polyuria.  Genitourinary:  Negative for difficulty urinating, dysuria, flank pain, frequency, hematuria and urgency.  Musculoskeletal:  Negative for arthralgias, gait problem, joint swelling and myalgias.  Skin:  Negative for color change, rash and wound.  Neurological:  Negative for dizziness, tremors, speech difficulty, weakness, light-headedness, numbness and headaches.  Hematological:  Does not bruise/bleed easily.  Psychiatric/Behavioral:  Negative for agitation, confusion and decreased concentration.    Per HPI unless specifically indicated above     Objective:    BP 126/76   Pulse 60   Temp 98.7 F (37.1 C) (Oral)   Ht 6' 2.02" (1.88 m)    Wt 274 lb 3.2 oz (124.4 kg)   SpO2 95%   BMI 35.19 kg/m   Wt Readings from Last 3 Encounters:  04/06/21 274 lb 3.2 oz (124.4 kg)  01/16/21 268 lb (121.6 kg)  10/28/20 270 lb (122.5 kg)    Physical Exam Vitals and nursing note reviewed.  Constitutional:      General: He is not in acute distress.    Appearance: Normal appearance. He is not ill-appearing or diaphoretic.  HENT:     Head: Normocephalic and atraumatic.     Right Ear: Tympanic membrane and external ear normal. There is no impacted cerumen.     Left Ear: External ear normal.     Nose: No congestion or rhinorrhea.     Mouth/Throat:     Pharynx: No oropharyngeal exudate or posterior oropharyngeal erythema.  Eyes:     Conjunctiva/sclera: Conjunctivae normal.     Pupils: Pupils are equal, round, and reactive to light.  Cardiovascular:     Rate and Rhythm: Normal rate and regular rhythm.     Heart sounds: No murmur heard.   No friction rub. No gallop.  Pulmonary:     Effort: No respiratory distress.     Breath sounds: No stridor. No wheezing or rhonchi.  Chest:     Chest wall: No tenderness.  Abdominal:     General: Abdomen is flat. Bowel sounds are normal.     Palpations: Abdomen is soft. There is no mass.     Tenderness: There is no abdominal tenderness.  Musculoskeletal:  Cervical back: Normal range of motion and neck supple. No rigidity or tenderness.     Left lower leg: No edema.  Skin:    General: Skin is warm and dry.  Neurological:     Mental Status: He is alert.    Results for orders placed or performed during the hospital encounter of 01/16/21  Surgical pathology  Result Value Ref Range   SURGICAL PATHOLOGY      SURGICAL PATHOLOGY CASE: [id number] PATIENT: Micheal Khan Surgical Pathology Report     Specimen Submitted: A. Colon polyp, ascending; hot snare B. Colon polyp, descending; cbx  Clinical History: History of colon polyps.  Diverticulosis    DIAGNOSIS: A. COLON POLYP,  ASCENDING; HOT SNARE: - MULTIPLE FRAGMENTS OF TUBULOVILLOUS ADENOMA. - POLYP BASE CANNOT BE DEFINITIVELY ASCERTAINED SECONDARY TO SPECIMEN FRAGMENTATION. - NEGATIVE FOR HIGH-GRADE DYSPLASIA AND MALIGNANCY.  B. COLON POLYP, DESCENDING; COLD BIOPSY: - POLYPOID FRAGMENT OF BENIGN COLONIC MUCOSA WITH SUPERFICIAL HYPERPLASTIC CHANGES AND SMALL LYMPHOID AGGREGATE. - NEGATIVE FOR DYSPLASIA AND MALIGNANCY.  GROSS DESCRIPTION: A. Labeled: Hot snare polyp ascending colon Received: Formalin Collection time: 7:55 AM on 01/16/2021 Placed into formalin time: 7:55 AM on 01/16/2021 Tissue fragment(s): Multiple Size: Aggregate, 1.7 x 0.7 x 0.2 cm Description: Tan-pink soft tissue fragments Entirely submit ted in 1 cassette.  B. Labeled: cbx polyp descending colon Received: Formalin Collection time: 8:03 AM on 01/16/2021 Placed into formalin time: 8:03 AM on 01/16/2021 Tissue fragment(s): 2 Size: Range from 0.4-0.5 cm Description: Tan soft tissue fragments Entirely submitted in 1 cassette.  RB 01/16/2021  Final Diagnosis performed by Allena Napoleon, MD.   Electronically signed 01/19/2021 1:42:03PM The electronic signature indicates that the named Attending Pathologist has evaluated the specimen Technical component performed at [facility], [address], [place], [place] [address] Lab: [phone] Dir: Rush Farmer, MD, MMM  Professional component performed at [facility], [facility], [place], [place], [place] [address] Lab: [phone] Dir: Dellia Nims. Rubinas, MD         Current Outpatient Medications:    cholecalciferol (VITAMIN D) 1000 units tablet, Take 1,000 Units by mouth daily., Disp: , Rfl:    Cyanocobalamin (VITAMIN B 12 PO), Take by mouth daily., Disp: , Rfl:    GARLIC OIL PO, Take by mouth daily. 1500 mg, Disp: , Rfl:    Multiple Vitamin (ONE-A-DAY MENS PO), Take 1 tablet by mouth daily., Disp: , Rfl:    Probiotic Product (PROBIOTIC DAILY PO), Take by mouth daily.,  Disp: , Rfl:    UNABLE TO FIND, Apple cider vinagre. Liquid. One table spoon a day, Disp: , Rfl:    lisinopril (ZESTRIL) 5 MG tablet, Take 1 tablet (5 mg total) by mouth daily., Disp: 90 tablet, Rfl: 1    Assessment & Plan:  Physical. PHYSICAL :  Physical Wnl will check CMP, FLP, CBC,TSH, PSA.  2. HTN stable, is on Lisinopril for such  Continue current meds.  Medication compliance emphasised. pt advised to keep Bp logs. Pt verbalised understanding of the same. Pt to have a low salt diet . Exercise to reach a goal of at least 150 mins a week.  lifestyle modifications explained and pt understands importance of the above.  Labs today   3. Mild - Mod aorta dilatation. Fu with cards for such, has had an ECHO recently - 45 mm on echo, 4.3 mm on CT, needs to fu with cards as she does annually, to have serial echocardiograms  Problem List Items Addressed This Visit   None  Visit Diagnoses     Primary hypertension    -  Primary   Relevant Medications   lisinopril (ZESTRIL) 5 MG tablet   Other Relevant Orders   TSH   PSA   Lipid panel   CBC with Differential/Platelet   Comprehensive metabolic panel   Urinalysis, Routine w reflex microscopic        Orders Placed This Encounter  Procedures   TSH   PSA   Lipid panel   CBC with Differential/Platelet   Comprehensive metabolic panel   Urinalysis, Routine w reflex microscopic     Meds ordered this encounter  Medications   lisinopril (ZESTRIL) 5 MG tablet    Sig: Take 1 tablet (5 mg total) by mouth daily.    Dispense:  90 tablet    Refill:  1     Follow up plan: No follow-ups on file.  Health Maintenance :  Cscope : 01/16/2021

## 2021-04-07 LAB — LIPID PANEL
Chol/HDL Ratio: 5 ratio (ref 0.0–5.0)
Cholesterol, Total: 222 mg/dL — ABNORMAL HIGH (ref 100–199)
HDL: 44 mg/dL (ref >39)
LDL Chol Calc (NIH): 147 mg/dL — ABNORMAL HIGH (ref 0–99)
Triglycerides: 171 mg/dL — ABNORMAL HIGH (ref 0–149)
VLDL Cholesterol Cal: 31 mg/dL (ref 5–40)

## 2021-04-07 LAB — CBC WITH DIFFERENTIAL/PLATELET
Basophils Absolute: 0 10*3/uL (ref 0.0–0.2)
Basos: 1 %
EOS (ABSOLUTE): 0.2 10*3/uL (ref 0.0–0.4)
Eos: 3 %
Hematocrit: 45.7 % (ref 37.5–51.0)
Hemoglobin: 15 g/dL (ref 13.0–17.7)
Immature Grans (Abs): 0 10*3/uL (ref 0.0–0.1)
Immature Granulocytes: 0 %
Lymphocytes Absolute: 1.7 10*3/uL (ref 0.7–3.1)
Lymphs: 32 %
MCH: 30.9 pg (ref 26.6–33.0)
MCHC: 32.8 g/dL (ref 31.5–35.7)
MCV: 94 fL (ref 79–97)
Monocytes Absolute: 0.6 10*3/uL (ref 0.1–0.9)
Monocytes: 11 %
Neutrophils Absolute: 2.8 10*3/uL (ref 1.4–7.0)
Neutrophils: 53 %
Platelets: 211 10*3/uL (ref 150–450)
RBC: 4.85 x10E6/uL (ref 4.14–5.80)
RDW: 12.4 % (ref 11.6–15.4)
WBC: 5.3 10*3/uL (ref 3.4–10.8)

## 2021-04-07 LAB — COMPREHENSIVE METABOLIC PANEL
ALT: 22 IU/L (ref 0–44)
AST: 20 IU/L (ref 0–40)
Albumin/Globulin Ratio: 2.2 (ref 1.2–2.2)
Albumin: 4.8 g/dL (ref 3.8–4.9)
Alkaline Phosphatase: 67 IU/L (ref 44–121)
BUN/Creatinine Ratio: 12 (ref 9–20)
BUN: 15 mg/dL (ref 6–24)
Bilirubin Total: 0.5 mg/dL (ref 0.0–1.2)
CO2: 25 mmol/L (ref 20–29)
Calcium: 9.9 mg/dL (ref 8.7–10.2)
Chloride: 103 mmol/L (ref 96–106)
Creatinine, Ser: 1.23 mg/dL (ref 0.76–1.27)
Globulin, Total: 2.2 g/dL (ref 1.5–4.5)
Glucose: 102 mg/dL — ABNORMAL HIGH (ref 65–99)
Potassium: 4.6 mmol/L (ref 3.5–5.2)
Sodium: 143 mmol/L (ref 134–144)
Total Protein: 7 g/dL (ref 6.0–8.5)
eGFR: 68 mL/min/{1.73_m2} (ref >59)

## 2021-04-07 LAB — TSH: TSH: 3.8 u[IU]/mL (ref 0.450–4.500)

## 2021-04-07 LAB — PSA: Prostate Specific Ag, Serum: 0.6 ng/mL (ref 0.0–4.0)

## 2021-05-29 IMAGING — CT CT HEART MORP W/ CTA COR W/ SCORE W/ CA W/CM &/OR W/O CM
1 of 15 series · 2 of 20 positions shown, 3 images · non-contrast
Comparison: 10/08/2008

Addendum:
CLINICAL DATA: Chest pain

EXAM:
Cardiac/Coronary  CTA
TECHNIQUE: The patient was scanned on a Siemens Somatoform go.Top scanner.

[Series 27: multiphase % cta coronary 0.60 · axial · 0.48mm/px · z∈[-1081,-1038]mm · 2 of 3230 slices shown, 3 images]
[im 1077/3230  vessel]
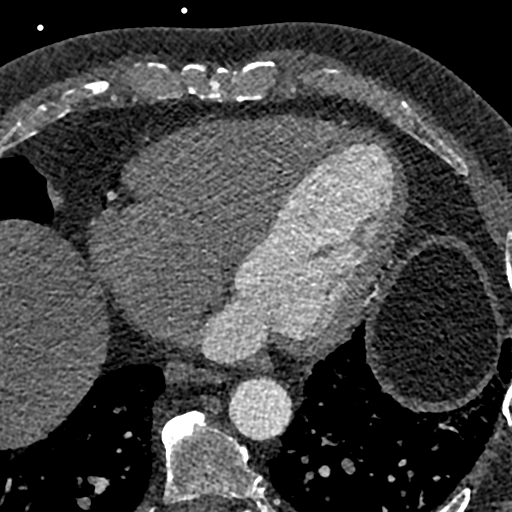
[im 1077/3230  lung]
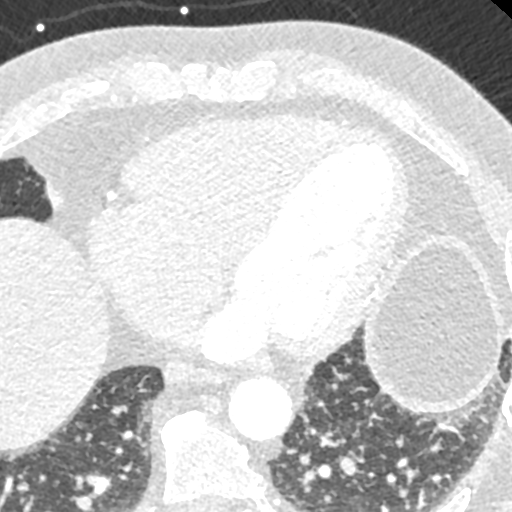
[im 2153/3230  vessel]
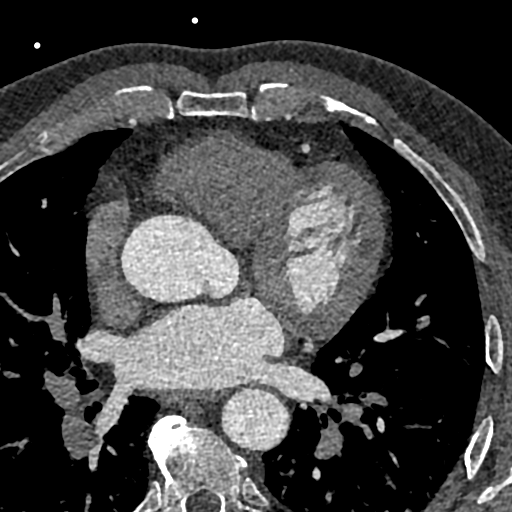

[2 of 20 positions shown; findings below may reference images not displayed]

FINDINGS: A retrospective scan was triggered in the descending thoracic aorta.
Axial non-contrast 3 mm slices were carried out through the heart.
The data set was analyzed on a dedicated work station and scored
using the Agatson method. Gantry rotation speed was 330 msecs and
collimation was .6 mm. 50mg of metoprolol and 0.8 mg of sl NTG was
given. The 3D data set was reconstructed in 5% intervals of the
60-95 % of the R-R cycle. Diastolic phases were analyzed on a
dedicated work station using MPR, MIP and VRT modes. The patient
received 90 cc of contrast.

Aorta:  Normal size.  No calcifications.  No dissection.

Aortic Valve:  Trileaflet.  No calcifications.

Coronary Arteries:  Normal coronary origin.  Right dominance.

RCA is a dominant artery that gives rise to PDA and PLA. There is no
plaque.

Left main is a large artery that gives rise to LAD and LCX arteries.

LAD has no plaque, wraps around apex

LCX is a non-dominant artery that gives rise to one OM1 branch.
There is no plaque.

Other findings:

Normal pulmonary vein drainage into the left atrium.

Normal left atrial appendage without a thrombus.

Normal size of the pulmonary artery.
IMPRESSION: 1. Normal coronary calcium score of 0. Patient is low risk for
coronary events.

2. Normal coronary origin with right dominance.

3. No evidence of CAD.

4. CAD-RADS 0. Consider non-atherosclerotic causes of chest pain.

EXAM:
OVER-READ INTERPRETATION  CT CHEST

The following report is an over-read performed by radiologist Dr.
Hollosy Kazinczi [[email or web address]] on 10/09/2020. This over-read
does not include interpretation of cardiac or coronary anatomy or
pathology. The coronary CTA interpretation by the cardiologist is
attached.
FINDINGS: Ascending thoracic aorta aneurysmal at 4.3 cm. Heart is normal size.
No adenopathy. Platelike atelectasis at the left lung base. No
confluent opacities or effusions. Imaging into the upper abdomen
demonstrates no acute findings. Chest wall soft tissues are
unremarkable. No acute bony abnormality.
IMPRESSION: 4.3 cm ascending thoracic aortic aneurysm. Recommend annual imaging
followup by CTA or MRA. This recommendation follows 1444
ACCF/AHA/AATS/ACR/ASA/SCA/TRIPTI/SAKARAIA/CELI/SATHAR Guidelines for the
Diagnosis and Management of Patients with Thoracic Aortic Disease.
Circulation. 1444; 121: E266-e369. Aortic aneurysm NOS ([id number])

*** End of Addendum ***
FINDINGS: A retrospective scan was triggered in the descending thoracic aorta.
Axial non-contrast 3 mm slices were carried out through the heart.
The data set was analyzed on a dedicated work station and scored
using the Agatson method. Gantry rotation speed was 330 msecs and
collimation was .6 mm. 50mg of metoprolol and 0.8 mg of sl NTG was
given. The 3D data set was reconstructed in 5% intervals of the
60-95 % of the R-R cycle. Diastolic phases were analyzed on a
dedicated work station using MPR, MIP and VRT modes. The patient
received 90 cc of contrast.

Aorta:  Normal size.  No calcifications.  No dissection.

Aortic Valve:  Trileaflet.  No calcifications.

Coronary Arteries:  Normal coronary origin.  Right dominance.

RCA is a dominant artery that gives rise to PDA and PLA. There is no
plaque.

Left main is a large artery that gives rise to LAD and LCX arteries.

LAD has no plaque, wraps around apex

LCX is a non-dominant artery that gives rise to one OM1 branch.
There is no plaque.

Other findings:

Normal pulmonary vein drainage into the left atrium.

Normal left atrial appendage without a thrombus.

Normal size of the pulmonary artery.
IMPRESSION: 1. Normal coronary calcium score of 0. Patient is low risk for
coronary events.

2. Normal coronary origin with right dominance.

3. No evidence of CAD.

4. CAD-RADS 0. Consider non-atherosclerotic causes of chest pain.

## 2021-09-18 DIAGNOSIS — H2513 Age-related nuclear cataract, bilateral: Secondary | ICD-10-CM | POA: Diagnosis not present

## 2021-09-25 DIAGNOSIS — D2330 Other benign neoplasm of skin of unspecified part of face: Secondary | ICD-10-CM | POA: Diagnosis not present

## 2021-10-06 ENCOUNTER — Encounter: Payer: Self-pay | Admitting: Internal Medicine

## 2021-10-06 ENCOUNTER — Other Ambulatory Visit: Payer: Self-pay

## 2021-10-06 ENCOUNTER — Ambulatory Visit (INDEPENDENT_AMBULATORY_CARE_PROVIDER_SITE_OTHER): Payer: BC Managed Care – PPO | Admitting: Internal Medicine

## 2021-10-06 VITALS — BP 106/70 | HR 69 | Temp 98.9°F | Ht 74.02 in | Wt 278.8 lb

## 2021-10-06 DIAGNOSIS — I10 Essential (primary) hypertension: Secondary | ICD-10-CM | POA: Diagnosis not present

## 2021-10-06 DIAGNOSIS — E782 Mixed hyperlipidemia: Secondary | ICD-10-CM

## 2021-10-06 LAB — URINALYSIS, ROUTINE W REFLEX MICROSCOPIC
Bilirubin, UA: NEGATIVE
Glucose, UA: NEGATIVE
Ketones, UA: NEGATIVE
Leukocytes,UA: NEGATIVE
Nitrite, UA: NEGATIVE
Protein,UA: NEGATIVE
RBC, UA: NEGATIVE
Specific Gravity, UA: 1.01 (ref 1.005–1.030)
Urobilinogen, Ur: 0.2 mg/dL (ref 0.2–1.0)
pH, UA: 6 (ref 5.0–7.5)

## 2021-10-06 MED ORDER — OMEPRAZOLE 20 MG PO CPDR: 20.0000 mg | DELAYED_RELEASE_CAPSULE | Freq: Every day | ORAL | 3 refills | Status: DC

## 2021-10-06 NOTE — Progress Notes (Signed)
BP 106/70    Pulse 69    Temp 98.9 F (37.2 C) (Oral)    Ht 6' 2.02" (1.88 m)    Wt 278 lb 12.8 oz (126.5 kg)    SpO2 96%    BMI 35.78 kg/m    Subjective:    Patient ID: Micheal Khan, male    DOB: Feb 11, 1961, 60 y.o.   MRN: [id number]  Chief Complaint  Patient presents with   Hypertension   Gastroesophageal Reflux   Chest congestion    Since Friday, took covid test on Saturday and was negative.   chest congestiom    HPI: Micheal Khan is a 60 y.o. male  Has had a URI / congested nose clear mucus last week - now nose is dried up , not coughing up any mucus/ temp at 100.5 took tyelnol this morning had a temp of 98 F normal.     Hypertension This is a chronic problem. Pertinent negatives include no chest pain, headaches or neck pain.  Gastroesophageal Reflux He complains of coughing. He reports no abdominal pain, no chest pain, no nausea, no sore throat or no wheezing.  URI  This is a new problem. The current episode started in the past 7 days. The problem has been waxing and waning. The maximum temperature recorded prior to his arrival was 100.4 - 100.9 F. Associated symptoms include congestion and coughing. Pertinent negatives include no abdominal pain, chest pain, diarrhea, dysuria, ear pain, headaches, joint pain, joint swelling, nausea, neck pain, plugged ear sensation, rash, rhinorrhea, sinus pain, sneezing, sore throat, swollen glands, vomiting or wheezing.   Chief Complaint  Patient presents with   Hypertension   Gastroesophageal Reflux   Chest congestion    Since Friday, took covid test on Saturday and was negative.   chest congestiom    Relevant past medical, surgical, family and social history reviewed and updated as indicated. Interim medical history since our last visit reviewed. Allergies and medications reviewed and updated.  Review of Systems  Constitutional:  Negative for activity change, appetite change and chills.  HENT:  Positive for  congestion. Negative for ear pain, rhinorrhea, sinus pain, sneezing and sore throat.   Respiratory:  Positive for cough. Negative for apnea, chest tightness and wheezing.   Cardiovascular:  Negative for chest pain.  Gastrointestinal:  Negative for abdominal pain, diarrhea, nausea and vomiting.  Endocrine: Negative for cold intolerance.  Genitourinary:  Negative for difficulty urinating, dysuria and enuresis.  Musculoskeletal:  Negative for joint pain and neck pain.  Skin:  Negative for rash.  Neurological:  Negative for dizziness, light-headedness and headaches.   Per HPI unless specifically indicated above     Objective:    BP 106/70    Pulse 69    Temp 98.9 F (37.2 C) (Oral)    Ht 6' 2.02" (1.88 m)    Wt 278 lb 12.8 oz (126.5 kg)    SpO2 96%    BMI 35.78 kg/m   Wt Readings from Last 3 Encounters:  10/06/21 278 lb 12.8 oz (126.5 kg)  04/06/21 274 lb 3.2 oz (124.4 kg)  01/16/21 268 lb (121.6 kg)    Physical Exam Vitals and nursing note reviewed.  Constitutional:      General: He is not in acute distress.    Appearance: Normal appearance. He is not ill-appearing or diaphoretic.  HENT:     Head: Normocephalic and atraumatic.     Right Ear: Tympanic membrane and external ear normal. There is  no impacted cerumen.     Left Ear: External ear normal.     Nose: No congestion or rhinorrhea.     Mouth/Throat:     Pharynx: No oropharyngeal exudate or posterior oropharyngeal erythema.  Eyes:     Conjunctiva/sclera: Conjunctivae normal.     Pupils: Pupils are equal, round, and reactive to light.  Cardiovascular:     Rate and Rhythm: Normal rate and regular rhythm.     Heart sounds: No murmur heard.   No friction rub. No gallop.  Pulmonary:     Effort: No respiratory distress.     Breath sounds: No stridor. No wheezing or rhonchi.  Chest:     Chest wall: No tenderness.  Abdominal:     General: Abdomen is flat. Bowel sounds are normal.     Palpations: Abdomen is soft. There is no  mass.     Tenderness: There is no abdominal tenderness.  Musculoskeletal:     Cervical back: Normal range of motion and neck supple. No rigidity or tenderness.     Left lower leg: No edema.  Skin:    General: Skin is warm and dry.  Neurological:     Mental Status: He is alert.    Results for orders placed or performed in visit on 04/06/21  TSH  Result Value Ref Range   TSH 3.800 0.450 - 4.500 uIU/mL  PSA  Result Value Ref Range   Prostate Specific Ag, Serum 0.6 0.0 - 4.0 ng/mL  Lipid panel  Result Value Ref Range   Cholesterol, Total 222 (H) 100 - 199 mg/dL   Triglycerides 171 (H) 0 - 149 mg/dL   HDL 44 >39 mg/dL   VLDL Cholesterol Cal 31 5 - 40 mg/dL   LDL Chol Calc (NIH) 147 (H) 0 - 99 mg/dL   Chol/HDL Ratio 5.0 0.0 - 5.0 ratio  CBC with Differential/Platelet  Result Value Ref Range   WBC 5.3 3.4 - 10.8 x10E3/uL   RBC 4.85 4.14 - 5.80 x10E6/uL   Hemoglobin 15.0 13.0 - 17.7 g/dL   Hematocrit 45.7 37.5 - 51.0 %   MCV 94 79 - 97 fL   MCH 30.9 26.6 - 33.0 pg   MCHC 32.8 31.5 - 35.7 g/dL   RDW 12.4 11.6 - 15.4 %   Platelets 211 150 - 450 x10E3/uL   Neutrophils 53 Not Estab. %   Lymphs 32 Not Estab. %   Monocytes 11 Not Estab. %   Eos 3 Not Estab. %   Basos 1 Not Estab. %   Neutrophils Absolute 2.8 1.4 - 7.0 x10E3/uL   Lymphocytes Absolute 1.7 0.7 - 3.1 x10E3/uL   Monocytes Absolute 0.6 0.1 - 0.9 x10E3/uL   EOS (ABSOLUTE) 0.2 0.0 - 0.4 x10E3/uL   Basophils Absolute 0.0 0.0 - 0.2 x10E3/uL   Immature Granulocytes 0 Not Estab. %   Immature Grans (Abs) 0.0 0.0 - 0.1 x10E3/uL  Comprehensive metabolic panel  Result Value Ref Range   Glucose 102 (H) 65 - 99 mg/dL   BUN 15 6 - 24 mg/dL   Creatinine, Ser 1.23 0.76 - 1.27 mg/dL   eGFR 68 >59 mL/min/1.73   BUN/Creatinine Ratio 12 9 - 20   Sodium 143 134 - 144 mmol/L   Potassium 4.6 3.5 - 5.2 mmol/L   Chloride 103 96 - 106 mmol/L   CO2 25 20 - 29 mmol/L   Calcium 9.9 8.7 - 10.2 mg/dL   Total Protein 7.0 6.0 - 8.5 g/dL    Albumin  4.8 3.8 - 4.9 g/dL   Globulin, Total 2.2 1.5 - 4.5 g/dL   Albumin/Globulin Ratio 2.2 1.2 - 2.2   Bilirubin Total 0.5 0.0 - 1.2 mg/dL   Alkaline Phosphatase 67 44 - 121 IU/L   AST 20 0 - 40 IU/L   ALT 22 0 - 44 IU/L  Urinalysis, Routine w reflex microscopic  Result Value Ref Range   Specific Gravity, UA 1.020 1.005 - 1.030   pH, UA 6.5 5.0 - 7.5   Color, UA Yellow Yellow   Appearance Ur Clear Clear   Leukocytes,UA Negative Negative   Protein,UA Negative Negative/Trace   Glucose, UA Negative Negative   Ketones, UA Negative Negative   RBC, UA Negative Negative   Bilirubin, UA Negative Negative   Urobilinogen, Ur 0.2 0.2 - 1.0 mg/dL   Nitrite, UA Negative Negative        Current Outpatient Medications:    cholecalciferol (VITAMIN D) 1000 units tablet, Take 1,000 Units by mouth daily., Disp: , Rfl:    Cyanocobalamin (VITAMIN B 12 PO), Take by mouth daily., Disp: , Rfl:    GARLIC OIL PO, Take by mouth daily. 1500 mg, Disp: , Rfl:    lisinopril (ZESTRIL) 5 MG tablet, Take 1 tablet (5 mg total) by mouth daily., Disp: 90 tablet, Rfl: 1   Multiple Vitamin (ONE-A-DAY MENS PO), Take 1 tablet by mouth daily., Disp: , Rfl:    Omeprazole 20 MG TBDD, , Disp: , Rfl:    Probiotic Product (PROBIOTIC DAILY PO), Take by mouth daily., Disp: , Rfl:    UNABLE TO FIND, Apple cider vinegar Liquid. One table spoon a day, Disp: , Rfl:     Assessment & Plan:  GERD: continue pantoprazole 20 mg q.day as prescribed. patient advised to avoid laying down soon after his meals. He took a 2 hours between dinner and bedtime. Avoid spicy food and triggers that he knows food wise that worsen his acid reflux. Patient verbalized understanding of the above. Lifestyle modifications as above discussed with patient.  Problem List Items Addressed This Visit   None    No orders of the defined types were placed in this encounter.    No orders of the defined types were placed in this encounter.     Follow up plan: No follow-ups on file.

## 2021-10-06 NOTE — Patient Instructions (Signed)

## 2021-10-07 LAB — COMPREHENSIVE METABOLIC PANEL
ALT: 23 IU/L (ref 0–44)
AST: 23 IU/L (ref 0–40)
Albumin/Globulin Ratio: 2.3 — ABNORMAL HIGH (ref 1.2–2.2)
Albumin: 4.6 g/dL (ref 3.8–4.9)
Alkaline Phosphatase: 74 IU/L (ref 44–121)
BUN/Creatinine Ratio: 11 (ref 9–20)
BUN: 13 mg/dL (ref 6–24)
Bilirubin Total: 0.5 mg/dL (ref 0.0–1.2)
CO2: 24 mmol/L (ref 20–29)
Calcium: 9.1 mg/dL (ref 8.7–10.2)
Chloride: 103 mmol/L (ref 96–106)
Creatinine, Ser: 1.19 mg/dL (ref 0.76–1.27)
Globulin, Total: 2 g/dL (ref 1.5–4.5)
Glucose: 105 mg/dL — ABNORMAL HIGH (ref 70–99)
Potassium: 4.7 mmol/L (ref 3.5–5.2)
Sodium: 141 mmol/L (ref 134–144)
Total Protein: 6.6 g/dL (ref 6.0–8.5)
eGFR: 71 mL/min/{1.73_m2} (ref >59)

## 2021-10-07 LAB — CBC WITH DIFFERENTIAL/PLATELET
Basophils Absolute: 0 10*3/uL (ref 0.0–0.2)
Basos: 0 %
EOS (ABSOLUTE): 0.2 10*3/uL (ref 0.0–0.4)
Eos: 4 %
Hematocrit: 43.1 % (ref 37.5–51.0)
Hemoglobin: 14.7 g/dL (ref 13.0–17.7)
Immature Grans (Abs): 0 10*3/uL (ref 0.0–0.1)
Immature Granulocytes: 0 %
Lymphocytes Absolute: 1.3 10*3/uL (ref 0.7–3.1)
Lymphs: 26 %
MCH: 30.9 pg (ref 26.6–33.0)
MCHC: 34.1 g/dL (ref 31.5–35.7)
MCV: 91 fL (ref 79–97)
Monocytes Absolute: 0.8 10*3/uL (ref 0.1–0.9)
Monocytes: 16 %
Neutrophils Absolute: 2.7 10*3/uL (ref 1.4–7.0)
Neutrophils: 54 %
Platelets: 208 10*3/uL (ref 150–450)
RBC: 4.75 x10E6/uL (ref 4.14–5.80)
RDW: 12 % (ref 11.6–15.4)
WBC: 5.1 10*3/uL (ref 3.4–10.8)

## 2021-10-14 ENCOUNTER — Encounter: Payer: Self-pay | Admitting: Internal Medicine

## 2021-10-14 ENCOUNTER — Other Ambulatory Visit: Payer: Self-pay | Admitting: Internal Medicine

## 2021-10-14 MED ORDER — LISINOPRIL 5 MG PO TABS: 5.0000 mg | ORAL_TABLET | Freq: Every day | ORAL | 1 refills | Status: DC

## 2021-10-20 ENCOUNTER — Other Ambulatory Visit: Payer: Self-pay

## 2021-10-20 ENCOUNTER — Other Ambulatory Visit: Payer: BC Managed Care – PPO

## 2021-10-20 DIAGNOSIS — I10 Essential (primary) hypertension: Secondary | ICD-10-CM | POA: Diagnosis not present

## 2021-10-21 ENCOUNTER — Ambulatory Visit (INDEPENDENT_AMBULATORY_CARE_PROVIDER_SITE_OTHER): Payer: BC Managed Care – PPO

## 2021-10-21 DIAGNOSIS — I77810 Thoracic aortic ectasia: Secondary | ICD-10-CM

## 2021-10-21 LAB — LIPID PANEL
Chol/HDL Ratio: 5.5 ratio — ABNORMAL HIGH (ref 0.0–5.0)
Cholesterol, Total: 216 mg/dL — ABNORMAL HIGH (ref 100–199)
HDL: 39 mg/dL — ABNORMAL LOW (ref >39)
LDL Chol Calc (NIH): 140 mg/dL — ABNORMAL HIGH (ref 0–99)
Triglycerides: 207 mg/dL — ABNORMAL HIGH (ref 0–149)
VLDL Cholesterol Cal: 37 mg/dL (ref 5–40)

## 2021-10-21 LAB — ECHOCARDIOGRAM COMPLETE
AR max vel: 4.59 cm2
AV Area VTI: 4.57 cm2
AV Area mean vel: 4.29 cm2
AV Mean grad: 4 mmHg
AV Peak grad: 7.1 mmHg
Ao pk vel: 1.33 m/s
Area-P 1/2: 3.7 cm2
Calc EF: 52.8 %
S' Lateral: 4.25 cm
Single Plane A2C EF: 51.7 %
Single Plane A4C EF: 53.2 %

## 2021-10-26 ENCOUNTER — Encounter (INDEPENDENT_AMBULATORY_CARE_PROVIDER_SITE_OTHER): Payer: Self-pay

## 2021-10-26 ENCOUNTER — Ambulatory Visit (INDEPENDENT_AMBULATORY_CARE_PROVIDER_SITE_OTHER): Payer: BLUE CROSS/BLUE SHIELD | Admitting: Emergency Medicine

## 2021-10-26 VITALS — BP 130/79 | HR 85 | Temp 97.7°F | Resp 14 | Ht 68.0 in | Wt 185.0 lb

## 2021-10-26 DIAGNOSIS — T07XXXA Unspecified multiple injuries, initial encounter: Secondary | ICD-10-CM

## 2021-10-26 DIAGNOSIS — S8012XA Contusion of left lower leg, initial encounter: Secondary | ICD-10-CM

## 2021-10-26 NOTE — Progress Notes (Signed)
[facility]  PROGRESS NOTE     Patient: Philip Steele   Date: 10/26/2021   MRN: [id number]       CHAYANNE SPEIR is a 61 y.o. male      HISTORY     History obtained from: Patient    Chief Complaint   Patient presents with    Leg Injury     C/O left lower leg injury after falling off a ladder today.         HPI     61 year old male presents to the clinic with a chief complaint of lower leg pain.  Earlier today patient was on a ladder as it slid down and ended up falling.  Likely that the left foot went through the running on the step of the ladder landed on the lower leg.  No head trauma or loss of consciousness.  Did land largely on his left buttock.    Review of Systems    History:    Pertinent Past Medical, Surgical, Family and Social History were reviewed.      No current outpatient medications on file.    No Known Allergies    Medications and Allergies reviewed.    PHYSICAL EXAM     Vitals:    10/26/21 1511   BP: 130/79   Pulse: 85   Resp: 14   Temp: 97.7 F (36.5 C)   TempSrc: Temporal   SpO2: 98%   Weight: 83.9 kg (185 lb)   Height: 1.727 m (5\' 8" )       Physical Exam  HENT:      Head: Normocephalic and atraumatic.   Musculoskeletal:      Left lower leg: Swelling and tenderness present. No deformity, lacerations or bony tenderness. No edema.      Comments: Left lower leg at distal two thirds laterally: Area of abrasion with some mild swelling, no ecchymosis.  No bony tenderness   Neurological:      General: No focal deficit present.      Mental Status: He is alert.      Gait: Gait normal.   Skin:     Comments: Abrasions to left posterior elbow, left buttock and left lateral lower leg   Psychiatric:         Mood and Affect: Mood normal.   Vitals and nursing note reviewed.       UCC COURSE     There were no labs reviewed with this patient during the visit.    There were no x-rays reviewed with this patient during the visit.    No current facility-administered medications for this visit.       PROCEDURES      Procedures    MEDICAL DECISION MAKING     History, physical, labs/studies most consistent with multiple abrasions, contusion left lower leg as the diagnosis.    Chart Review:  Prior PCP, Specialist and/or ED notes reviewed today: Not Applicable  Prior labs/images/studies reviewed today: Not Applicable    Differential Diagnosis:       ASSESSMENT     Encounter Diagnoses   Name Primary?    Multiple abrasions Yes    Contusion of left lower leg, initial encounter             PLAN      PLAN: \    61 year old male presents the clinic after fall off a ladder.  Exam consistent with multiple superficial abrasions as well as a contusion to the  lateral aspect of the distal two thirds of left lower leg.  No evidence of bony injury.  Ace wrap applied.  Can recommend rest, ice, elevation and NSAIDs.  Follow-up primary care as needed.      Orders Placed This Encounter   Procedures    Task for Ace Wrap (3 or 4 IN)     Requested Prescriptions      No prescriptions requested or ordered in this encounter       Discussed results and diagnosis with patient/family.  Reviewed warning signs for worsening condition, as well as, indications for follow-up with primary care physician and return to urgent care clinic.   Patient/family expressed understanding of instructions.     An After Visit Summary was provided to the patient.

## 2021-10-30 ENCOUNTER — Encounter: Payer: Self-pay | Admitting: Internal Medicine

## 2021-11-06 ENCOUNTER — Encounter: Payer: Self-pay | Admitting: Cardiology

## 2021-11-06 ENCOUNTER — Ambulatory Visit (INDEPENDENT_AMBULATORY_CARE_PROVIDER_SITE_OTHER): Payer: BC Managed Care – PPO | Admitting: Cardiology

## 2021-11-06 ENCOUNTER — Other Ambulatory Visit: Payer: Self-pay

## 2021-11-06 VITALS — BP 132/80 | HR 64 | Ht 74.0 in | Wt 287.0 lb

## 2021-11-06 DIAGNOSIS — I10 Essential (primary) hypertension: Secondary | ICD-10-CM | POA: Diagnosis not present

## 2021-11-06 DIAGNOSIS — I77810 Thoracic aortic ectasia: Secondary | ICD-10-CM

## 2021-11-06 DIAGNOSIS — E7800 Pure hypercholesterolemia, unspecified: Secondary | ICD-10-CM

## 2021-11-06 MED ORDER — ATORVASTATIN CALCIUM 40 MG PO TABS: 40.0000 mg | ORAL_TABLET | Freq: Every day | ORAL | 3 refills | Status: DC

## 2021-11-06 NOTE — Patient Instructions (Addendum)
Medication Instructions:   START Atorvastatin 40MG  - take one tablet my mouth daily.   *If you need a refill on your cardiac medications before your next appointment, please call your pharmacy*   Lab Work:  Lipid panel in 6 months  - You will need to be fasting. Please do not have anything to eat or drink after midnight the morning you have the lab work. You may only have water or black coffee with no cream or sugar.   Your physician recommends that you return for a FASTING lipid profile: In 6 months - You will need to be fasting. Please do not have anything to eat or drink after midnight the morning you have the lab work. You may only have water or black coffee with no cream or sugar.   We will call you closer to your appointment time and schedule you in our office for this lab draw.    Testing/Procedures:  Echocardiogram prior to your 2 year follow up   Please return to [facility] on ______________ at _______________ AM/PM for an Echocardiogram.  Your physician has requested that you have an echocardiogram. Echocardiography is a painless test that uses sound waves to create images of your heart. It provides your doctor with information about the size and shape of your heart and how well your hearts chambers and valves are working. This procedure takes approximately one hour. There are no restrictions for this procedure. Please note; depending on visual quality an IV may need to be placed.   Follow-Up: At [facility], you and your health needs are our priority.  As part of our continuing mission to provide you with exceptional heart care, we have created designated Provider Care Teams.  These Care Teams include your primary Cardiologist (physician) and Advanced Practice Providers (APPs -  Physician Assistants and Nurse Practitioners) who all work together to provide you with the care you need, when you need it.  We recommend signing up for the patient portal called  "MyChart".  Sign up information is provided on this After Visit Summary.  MyChart is used to connect with patients for Virtual Visits (Telemedicine).  Patients are able to view lab/test results, encounter notes, upcoming appointments, etc.  Non-urgent messages can be sent to your provider as well.   To learn more about what you can do with MyChart, go to [email or web address].    Your next appointment:   2 year(s)  The format for your next appointment:   In Person  Provider:   You may see Kate Sable, MD or one of the following Advanced Practice Providers on your designated Care Team:   Murray Hodgkins, NP Christell Faith, PA-C Cadence Kathlen Mody, [place]

## 2021-11-06 NOTE — Progress Notes (Signed)
Cardiology Office Note:    Date:  11/06/2021   ID:  Micheal Khan, DOB Aug 04, 1961, MRN [id number]  PCP:  Charlynne Cousins, MD  [facility] Cardiologist:  Kate Sable, MD  [facility] Electrophysiologist:  None   Referring MD: Charlynne Cousins, MD   Chief Complaint  Patient presents with   Other    12 month follow up -- Meds reviewed verbally with patient.      History of Present Illness:    Micheal Khan is a 61 y.o. male with a hx of hypertension, ascending aortic dilatation who presents for follow-up.    Initially seen for chest pain.  Work-up with echo and coronary CTA were normal.  Ascending aorta dilatation noted on echocardiogram.  Repeat echo performed to evaluate ascending aorta size.  Patient feels well, symptoms of chest pain have significantly improved with taking omeprazole.  Presents for echocardiogram results.   Prior notes Echo 10/2020 EF 55 to 60%.  Moderate ascending aorta dilatation 45 mm. Coronary CTA 09/2020 calcium score 0, no evidence of CAD Father passed from MI in his 58s   Past Medical History:  Diagnosis Date   Cancer ([place]) 07/2004   testicular-surgery and chemo   Complication of anesthesia    nausea with colonoscopy in [place] 9 years ago   Family history of breast cancer    Family history of colon cancer    Family history of melanoma    GERD (gastroesophageal reflux disease)    Hypertension    PONV (postoperative nausea and vomiting)     Past Surgical History:  Procedure Laterality Date   COLONOSCOPY WITH PROPOFOL     COLONOSCOPY WITH PROPOFOL N/A 01/08/2020   Procedure: COLONOSCOPY WITH PROPOFOL;  Surgeon: Jonathon Bellows, MD;  Location: [facility] ENDOSCOPY;  Service: Gastroenterology;  Laterality: N/A;   COLONOSCOPY WITH PROPOFOL N/A 01/16/2021   Procedure: COLONOSCOPY WITH PROPOFOL;  Surgeon: Jonathon Bellows, MD;  Location: [facility] ENDOSCOPY;  Service: Gastroenterology;  Laterality: N/A;   ESOPHAGOGASTRODUODENOSCOPY (EGD) WITH PROPOFOL      TESTICLE REMOVAL Left 07/2004    Current Medications: Current Meds  Medication Sig   atorvastatin (LIPITOR) 40 MG tablet Take 1 tablet (40 mg total) by mouth daily.   cholecalciferol (VITAMIN D) 1000 units tablet Take 1,000 Units by mouth daily.   Cyanocobalamin (VITAMIN B 12 PO) Take by mouth daily.   GARLIC OIL PO Take by mouth daily. 1500 mg   lisinopril (ZESTRIL) 5 MG tablet Take 1 tablet (5 mg total) by mouth daily.   Multiple Vitamin (ONE-A-DAY MENS PO) Take 1 tablet by mouth daily.   omeprazole (PRILOSEC) 20 MG capsule Take 1 capsule (20 mg total) by mouth daily.   Probiotic Product (PROBIOTIC DAILY PO) Take by mouth daily.   UNABLE TO FIND Apple cider vinegar Liquid. One table spoon a day     Allergies:   Patient has no known allergies.   Social History   Socioeconomic History   Marital status: Married    Spouse name: Not on file   Number of children: Not on file   Years of education: Not on file   Highest education level: Not on file  Occupational History   Not on file  Tobacco Use   Smoking status: Never   Smokeless tobacco: Never  Vaping Use   Vaping Use: Never used  Substance and Sexual Activity   Alcohol use: No    Alcohol/week: 0.0 standard drinks   Drug use: No   Sexual activity: Yes  Other Topics Concern   Not on file  Social History Narrative   Not on file   Social Determinants of Health   Financial Resource Strain: Not on file  Food Insecurity: Not on file  Transportation Needs: Not on file  Physical Activity: Not on file  Stress: Not on file  Social Connections: Not on file     Family History: The patient's family history includes Breast cancer (age of onset: 86) in his maternal aunt; Cancer in his maternal grandfather; Colon cancer (age of onset: 70) in his brother; Colon polyps in his paternal uncle; Heart disease in his father and paternal grandfather; Melanoma (age of onset: 32) in his mother; Stroke in his paternal grandmother.  ROS:    Please see the history of present illness.     All other systems reviewed and are negative.  EKGs/Labs/Other Studies Reviewed:    The following studies were reviewed today:   EKG:  EKG is  ordered today.  The ekg ordered today demonstrates sinus rhythm.  Recent Labs: 04/06/2021: TSH 3.800 10/06/2021: ALT 23; BUN 13; Creatinine, Ser 1.19; Hemoglobin 14.7; Platelets 208; Potassium 4.7; Sodium 141  Recent Lipid Panel    Component Value Date/Time   CHOL 216 (H) 10/20/2021 1000   TRIG 207 (H) 10/20/2021 1000   HDL 39 (L) 10/20/2021 1000   CHOLHDL 5.5 (H) 10/20/2021 1000   LDLCALC 140 (H) 10/20/2021 1000     Risk Assessment/Calculations:      Physical Exam:    VS:  BP 132/80 (BP Location: Left Arm, Patient Position: Sitting, Cuff Size: Normal)    Pulse 64    Ht 6\' 2"  (1.88 m)    Wt 287 lb (130.2 kg)    SpO2 96%    BMI 36.85 kg/m     Wt Readings from Last 3 Encounters:  11/06/21 287 lb (130.2 kg)  10/06/21 278 lb 12.8 oz (126.5 kg)  04/06/21 274 lb 3.2 oz (124.4 kg)     GEN:  Well nourished, well developed in no acute distress HEENT: Normal NECK: No JVD; No carotid bruits LYMPHATICS: No lymphadenopathy CARDIAC: RRR, no murmurs, rubs, gallops RESPIRATORY:  Clear to auscultation without rales, wheezing or rhonchi  ABDOMEN: Soft, non-tender, non-distended MUSCULOSKELETAL:  No edema; No deformity  SKIN: Warm and dry NEUROLOGIC:  Alert and oriented x 3 PSYCHIATRIC:  Normal affect   ASSESSMENT:    1. Primary hypertension   2. Ascending aorta dilatation (HCC)   3. Pure hypercholesterolemia     PLAN:    In order of problems listed above:  History of hypertension, BP controlled.  Continue lisinopril as prescribed. Ascending aorta dilatation, repeat echo 10/2021 shows mild aortic root and ascending aorta dilatation.  Aortic root 42 mm, ascending aorta 4 mm.  Findings stable from prior.  Plan repeat echo in 2 years. Hyperlipidemia, 10-year ASCVD risk 11.7%.  Start  Lipitor 40 mg daily.  Repeat fasting lipid profile in 6 months.  Will call patient regarding titrating statin if needed after repeat results are obtained.  Follow-up after echocardiogram .   Shared Decision Making/Informed Consent       Medication Adjustments/Labs and Tests Ordered: Current medicines are reviewed at length with the patient today.  Concerns regarding medicines are outlined above.  Orders Placed This Encounter  Procedures   Lipid panel   EKG 12-Lead   Meds ordered this encounter  Medications   atorvastatin (LIPITOR) 40 MG tablet    Sig: Take 1 tablet (40 mg total) by mouth  daily.    Dispense:  90 tablet    Refill:  3    Patient Instructions  Medication Instructions:   START Atorvastatin 40MG  - take one tablet my mouth daily.   *If you need a refill on your cardiac medications before your next appointment, please call your pharmacy*   Lab Work:  Lipid panel in 6 months  - You will need to be fasting. Please do not have anything to eat or drink after midnight the morning you have the lab work. You may only have water or black coffee with no cream or sugar.   Your physician recommends that you return for a FASTING lipid profile: In 6 months - You will need to be fasting. Please do not have anything to eat or drink after midnight the morning you have the lab work. You may only have water or black coffee with no cream or sugar.   We will call you closer to your appointment time and schedule you in our office for this lab draw.    Testing/Procedures:  Echocardiogram prior to your 2 year follow up   Please return to [facility] on ______________ at _______________ AM/PM for an Echocardiogram.  Your physician has requested that you have an echocardiogram. Echocardiography is a painless test that uses sound waves to create images of your heart. It provides your doctor with information about the size and shape of your heart and how well your  hearts chambers and valves are working. This procedure takes approximately one hour. There are no restrictions for this procedure. Please note; depending on visual quality an IV may need to be placed.   Follow-Up: At [facility], you and your health needs are our priority.  As part of our continuing mission to provide you with exceptional heart care, we have created designated Provider Care Teams.  These Care Teams include your primary Cardiologist (physician) and Advanced Practice Providers (APPs -  Physician Assistants and Nurse Practitioners) who all work together to provide you with the care you need, when you need it.  We recommend signing up for the patient portal called "MyChart".  Sign up information is provided on this After Visit Summary.  MyChart is used to connect with patients for Virtual Visits (Telemedicine).  Patients are able to view lab/test results, encounter notes, upcoming appointments, etc.  Non-urgent messages can be sent to your provider as well.   To learn more about what you can do with MyChart, go to [email or web address].    Your next appointment:   2 year(s)  The format for your next appointment:   In Person  Provider:   You may see Kate Sable, MD or one of the following Advanced Practice Providers on your designated Care Team:   Murray Hodgkins, NP Christell Faith, PA-C Cadence Kathlen Mody, [place]    Signed, Kate Sable, MD  11/06/2021 10:54 AM    [place]

## 2021-11-16 NOTE — Telephone Encounter (Signed)
noted 

## 2022-04-06 ENCOUNTER — Encounter: Payer: BC Managed Care – PPO | Admitting: Internal Medicine

## 2022-04-07 ENCOUNTER — Ambulatory Visit (INDEPENDENT_AMBULATORY_CARE_PROVIDER_SITE_OTHER): Payer: BC Managed Care – PPO | Admitting: Unknown Physician Specialty

## 2022-04-07 ENCOUNTER — Encounter: Payer: Self-pay | Admitting: Unknown Physician Specialty

## 2022-04-07 VITALS — BP 139/82 | HR 64 | Temp 98.1°F | Ht 74.6 in | Wt 278.4 lb

## 2022-04-07 DIAGNOSIS — Z0000 Encounter for general adult medical examination without abnormal findings: Secondary | ICD-10-CM

## 2022-04-07 DIAGNOSIS — E782 Mixed hyperlipidemia: Secondary | ICD-10-CM | POA: Diagnosis not present

## 2022-04-07 DIAGNOSIS — I10 Essential (primary) hypertension: Secondary | ICD-10-CM

## 2022-04-07 MED ORDER — LISINOPRIL 5 MG PO TABS: 5.0000 mg | ORAL_TABLET | Freq: Every day | ORAL | 1 refills | Status: DC

## 2022-04-07 NOTE — Progress Notes (Signed)
BP 139/82   Pulse 64   Temp 98.1 F (36.7 C) (Oral)   Ht 6' 2.6" (1.895 m)   Wt 278 lb 6.4 oz (126.3 kg)   SpO2 96%   BMI 35.17 kg/m    Subjective:    Patient ID: Micheal Khan, male    DOB: 1961/07/25, 61 y.o.   MRN: [id number]  HPI: Micheal Khan is a 60 y.o. male  Chief Complaint  Patient presents with   Annual Exam   Hypertension Using medications without difficulty Average home BPs- not checked in 6 months  No problems or lightheadedness No chest pain with exertion or shortness of breath No Edema   Hyperlipidemia Using medications without problems: Started Lisinopril per cardiology.  He has been on it 5 months.   No Muscle aches  Diet compliance: States he needs to cut back more Exercise: Walks 3 1/2 miles days  GERD Omeprazole seems to work well     04/07/2022    8:17 AM 10/06/2021    9:02 AM 04/06/2021    8:09 AM 03/26/2020    2:46 PM 03/21/2019    2:53 PM  Depression screen PHQ 2/9  Decreased Interest 0 0 0 0 0  Down, Depressed, Hopeless 0 0 0 0 0  PHQ - 2 Score 0 0 0 0 0  Altered sleeping 0 0 0 0 0  Tired, decreased energy 0 0 0 0 0  Change in appetite 0 0 0 0 0  Feeling bad or failure about yourself  0 0 0 0 0  Trouble concentrating 0 0 0 0 0  Moving slowly or fidgety/restless 0 0 0 0 0  Suicidal thoughts 0 0 0 0 0  PHQ-9 Score 0 0 0 0 0  Difficult doing work/chores Not difficult at all Not difficult at all Not difficult at all      Relevant past medical, surgical, family and social history reviewed and updated as indicated. Interim medical history since our last visit reviewed. Allergies and medications reviewed and updated.  Review of Systems  Genitourinary:        Erections are less frequent  All other systems reviewed and are negative.   Per HPI unless specifically indicated above     Objective:    BP 139/82   Pulse 64   Temp 98.1 F (36.7 C) (Oral)   Ht 6' 2.6" (1.895 m)   Wt 278 lb 6.4 oz (126.3 kg)   SpO2 96%   BMI  35.17 kg/m   Wt Readings from Last 3 Encounters:  04/07/22 278 lb 6.4 oz (126.3 kg)  11/06/21 287 lb (130.2 kg)  10/06/21 278 lb 12.8 oz (126.5 kg)    Physical Exam Constitutional:      Appearance: He is well-developed.  HENT:     Head: Normocephalic.     Right Ear: Tympanic membrane, ear canal and external ear normal.     Left Ear: Tympanic membrane, ear canal and external ear normal.     Mouth/Throat:     Pharynx: Uvula midline.  Eyes:     Pupils: Pupils are equal, round, and reactive to light.  Cardiovascular:     Rate and Rhythm: Normal rate and regular rhythm.     Heart sounds: Normal heart sounds. No murmur heard.    No friction rub. No gallop.  Pulmonary:     Effort: Pulmonary effort is normal. No respiratory distress.     Breath sounds: Normal breath sounds.  Abdominal:  General: Bowel sounds are normal. There is no distension.     Palpations: Abdomen is soft.     Tenderness: There is no abdominal tenderness.  Genitourinary:    Comments: Shared decision making.  Deferred prostate exam Musculoskeletal:        General: Normal range of motion.  Skin:    General: Skin is warm and dry.  Neurological:     Mental Status: He is alert and oriented to person, place, and time.     Deep Tendon Reflexes: Reflexes are normal and symmetric.  Psychiatric:        Behavior: Behavior normal.        Thought Content: Thought content normal.        Judgment: Judgment normal.     Results for orders placed or performed in visit on 10/21/21  ECHOCARDIOGRAM COMPLETE  Result Value Ref Range   AR max vel 4.59 cm2   AV Peak grad 7.1 mmHg   Ao pk vel 1.33 m/s   S' Lateral 4.25 cm   Area-P 1/2 3.70 cm2   AV Area VTI 4.57 cm2   AV Mean grad 4.0 mmHg   Single Plane A4C EF 53.2 %   Single Plane A2C EF 51.7 %   Calc EF 52.8 %   AV Area mean vel 4.29 cm2      Assessment & Plan:   Problem List Items Addressed This Visit       Unprioritized   Essential hypertension, benign     A little high today.  Let us know if it is consistently above 130/80      Relevant Medications   lisinopril (ZESTRIL) 5 MG tablet   Hyperlipidemia    Started Atorvastatin.  Order a lipid panel which will reflect Atorvastatin      Relevant Medications   lisinopril (ZESTRIL) 5 MG tablet   Other Visit Diagnoses     Annual physical exam    -  Primary   Health maintenance up to date.  Flu shot for the fall   Relevant Orders   CBC with Differential/Platelet   Comprehensive metabolic panel   Lipid Panel w/o Chol/HDL Ratio   TSH   PSA        Follow up plan: Return in about 6 months (around 10/07/2022).

## 2022-04-07 NOTE — Assessment & Plan Note (Signed)
A little high today.  Let us know if it is consistently above 130/80

## 2022-04-07 NOTE — Assessment & Plan Note (Signed)
Started Atorvastatin.  Order a lipid panel which will reflect Atorvastatin

## 2022-04-08 LAB — TSH: TSH: 3.45 u[IU]/mL (ref 0.450–4.500)

## 2022-04-08 LAB — LIPID PANEL W/O CHOL/HDL RATIO
Cholesterol, Total: 109 mg/dL (ref 100–199)
HDL: 41 mg/dL (ref >39)
LDL Chol Calc (NIH): 48 mg/dL (ref 0–99)
Triglycerides: 107 mg/dL (ref 0–149)
VLDL Cholesterol Cal: 20 mg/dL (ref 5–40)

## 2022-04-08 LAB — CBC WITH DIFFERENTIAL/PLATELET
Basophils Absolute: 0 10*3/uL (ref 0.0–0.2)
Basos: 1 %
EOS (ABSOLUTE): 0.1 10*3/uL (ref 0.0–0.4)
Eos: 3 %
Hematocrit: 42.2 % (ref 37.5–51.0)
Hemoglobin: 14.4 g/dL (ref 13.0–17.7)
Immature Grans (Abs): 0 10*3/uL (ref 0.0–0.1)
Immature Granulocytes: 1 %
Lymphocytes Absolute: 1.6 10*3/uL (ref 0.7–3.1)
Lymphs: 28 %
MCH: 31.5 pg (ref 26.6–33.0)
MCHC: 34.1 g/dL (ref 31.5–35.7)
MCV: 92 fL (ref 79–97)
Monocytes Absolute: 0.5 10*3/uL (ref 0.1–0.9)
Monocytes: 9 %
Neutrophils Absolute: 3.3 10*3/uL (ref 1.4–7.0)
Neutrophils: 58 %
Platelets: 227 10*3/uL (ref 150–450)
RBC: 4.57 x10E6/uL (ref 4.14–5.80)
RDW: 12.6 % (ref 11.6–15.4)
WBC: 5.5 10*3/uL (ref 3.4–10.8)

## 2022-04-08 LAB — COMPREHENSIVE METABOLIC PANEL
ALT: 22 IU/L (ref 0–44)
AST: 15 IU/L (ref 0–40)
Albumin/Globulin Ratio: 2.1 (ref 1.2–2.2)
Albumin: 4.5 g/dL (ref 3.8–4.9)
Alkaline Phosphatase: 81 IU/L (ref 44–121)
BUN/Creatinine Ratio: 12 (ref 9–20)
BUN: 14 mg/dL (ref 6–24)
Bilirubin Total: 0.5 mg/dL (ref 0.0–1.2)
CO2: 23 mmol/L (ref 20–29)
Calcium: 9.5 mg/dL (ref 8.7–10.2)
Chloride: 104 mmol/L (ref 96–106)
Creatinine, Ser: 1.15 mg/dL (ref 0.76–1.27)
Globulin, Total: 2.1 g/dL (ref 1.5–4.5)
Glucose: 96 mg/dL (ref 70–99)
Potassium: 4.3 mmol/L (ref 3.5–5.2)
Sodium: 140 mmol/L (ref 134–144)
Total Protein: 6.6 g/dL (ref 6.0–8.5)
eGFR: 73 mL/min/{1.73_m2} (ref >59)

## 2022-04-08 LAB — PSA: Prostate Specific Ag, Serum: 0.6 ng/mL (ref 0.0–4.0)

## 2022-04-14 ENCOUNTER — Encounter: Payer: Self-pay | Admitting: Unknown Physician Specialty

## 2022-04-15 ENCOUNTER — Other Ambulatory Visit: Payer: Self-pay | Admitting: Unknown Physician Specialty

## 2022-04-15 MED ORDER — OMEPRAZOLE 20 MG PO CPDR: 20.0000 mg | DELAYED_RELEASE_CAPSULE | Freq: Every day | ORAL | 3 refills | Status: DC

## 2022-04-16 ENCOUNTER — Telehealth: Payer: Self-pay | Admitting: Cardiology

## 2022-04-16 NOTE — Telephone Encounter (Signed)
Lmov to remind patient labs are due

## 2022-04-19 ENCOUNTER — Encounter: Payer: Self-pay | Admitting: Cardiology

## 2022-04-19 NOTE — Telephone Encounter (Signed)
Called patient and informed him that he does not have to get another lab draw and will route this message to Dr. Garen Lah to review the Lipids that were drawn on 04/07/22. I informed patient that I will call him with any recommendations that he may have once reviewed. Patient was grateful for the follow up.

## 2022-04-19 NOTE — Telephone Encounter (Signed)
Patient calling stating he did have lab work the end of June with his PCP. He would like to make sure whether he still needs labs.

## 2022-04-19 NOTE — Telephone Encounter (Signed)
error 

## 2022-04-20 NOTE — Telephone Encounter (Signed)
Called patient and informed him of Dr. Thereasa Solo note below. Patient was grateful for the follow up.  Kate Sable, MD  You 17 hours ago (5:42 PM)    Cholesterol adequately controlled, continue medications as prescribed.

## 2022-05-18 DIAGNOSIS — L570 Actinic keratosis: Secondary | ICD-10-CM | POA: Diagnosis not present

## 2022-05-18 DIAGNOSIS — Z872 Personal history of diseases of the skin and subcutaneous tissue: Secondary | ICD-10-CM | POA: Diagnosis not present

## 2022-05-18 DIAGNOSIS — D485 Neoplasm of uncertain behavior of skin: Secondary | ICD-10-CM | POA: Diagnosis not present

## 2022-05-18 DIAGNOSIS — Z86018 Personal history of other benign neoplasm: Secondary | ICD-10-CM | POA: Diagnosis not present

## 2022-05-18 DIAGNOSIS — L578 Other skin changes due to chronic exposure to nonionizing radiation: Secondary | ICD-10-CM | POA: Diagnosis not present

## 2022-05-18 DIAGNOSIS — D225 Melanocytic nevi of trunk: Secondary | ICD-10-CM | POA: Diagnosis not present

## 2022-06-01 ENCOUNTER — Encounter: Payer: Self-pay | Admitting: Physician Assistant

## 2022-06-01 ENCOUNTER — Ambulatory Visit (INDEPENDENT_AMBULATORY_CARE_PROVIDER_SITE_OTHER): Payer: BC Managed Care – PPO | Admitting: Physician Assistant

## 2022-06-01 VITALS — BP 132/77 | HR 61 | Temp 98.7°F | Wt 267.4 lb

## 2022-06-01 DIAGNOSIS — R1013 Epigastric pain: Secondary | ICD-10-CM | POA: Diagnosis not present

## 2022-06-01 NOTE — Progress Notes (Signed)
Established Patient Office Visit  Name: BRNADON Khan   MRN: [id number]    DOB: [birth year]   Date:06/01/2022  Today's Provider: Talitha Givens, MHS, PA-C Introduced myself to the patient as a PA-C and provided education on APPs in clinical practice.         Subjective  Chief Complaint  Chief Complaint  Patient presents with   GI Problem    Pt states he has been having issues with gastritis. States he has taken Omeprazole with this but was advised to cut back on this. States he has been taking the medicine 3 times per week until a week and a half ago when he went back to taking his medication daily because his stomach was hurting again. States he has been trying to eat a more bland diet and take things like Mylanta and Tums. States he feels that his stomach pain is coming and going. States he has been having trouble sleeping as well.     GI Problem The primary symptoms include abdominal pain (abdominal discomfort). Primary symptoms do not include nausea, vomiting or diarrhea.  The illness does not include constipation.    Gastritis  Thinks this is flaring  Reports he was controlling it pretty well with Omeprazole but was instructed to taper it  Had gotten down to 3 days per week but noticed about a week ago he started having stomach discomfort He has started taking Omeprazole daily for the past week He has been trying to eat bland diet with smaller portions He has been using Famotidine, TUMS, Mylanta  Reports resolution sat and sun but symptoms seemed to resurge yesterday after eating a pork loin  Reports general discomfort in epigastric region- no pain  Using garlic and apple cider vinegar   Reports regular bowel movements  Denies nausea, vomiting and diarrhea     Patient Active Problem List   Diagnosis Date Noted   Chest pain 09/08/2020   Personal history of colonic polyps 01/31/2020   Family history of colon cancer    Family history of melanoma    Family history  of breast cancer    Hyperlipidemia 09/20/2019   Anxiety 07/23/2018   Obesity (BMI 35.0-39.9 without comorbidity) 03/15/2018   Abdominal pain 05/13/2017   Testicular cancer ([place]) 05/23/2015   GERD (gastroesophageal reflux disease) 05/23/2015   Essential hypertension, benign 05/23/2015    Past Surgical History:  Procedure Laterality Date   COLONOSCOPY WITH PROPOFOL     COLONOSCOPY WITH PROPOFOL N/A 01/08/2020   Procedure: COLONOSCOPY WITH PROPOFOL;  Surgeon: Jonathon Bellows, MD;  Location: [facility] ENDOSCOPY;  Service: Gastroenterology;  Laterality: N/A;   COLONOSCOPY WITH PROPOFOL N/A 01/16/2021   Procedure: COLONOSCOPY WITH PROPOFOL;  Surgeon: Jonathon Bellows, MD;  Location: [facility] ENDOSCOPY;  Service: Gastroenterology;  Laterality: N/A;   ESOPHAGOGASTRODUODENOSCOPY (EGD) WITH PROPOFOL     TESTICLE REMOVAL Left 07/2004    Family History  Problem Relation Age of Onset   Melanoma Mother 70       metastatic to brain   Heart disease Father        MI   Colon cancer Brother 39   Cancer Maternal Grandfather        unknown type, maybe brain tumor   Stroke Paternal Grandmother    Heart disease Paternal Grandfather    Breast cancer Maternal Aunt 73   Colon polyps Paternal Uncle        less than 10 cumulative    Social  History   Tobacco Use   Smoking status: Never   Smokeless tobacco: Never  Substance Use Topics   Alcohol use: No    Alcohol/week: 0.0 standard drinks of alcohol     Current Outpatient Medications:    cholecalciferol (VITAMIN D) 1000 units tablet, Take 1,000 Units by mouth daily., Disp: , Rfl:    GARLIC OIL PO, Take by mouth daily. 1500 mg, Disp: , Rfl:    lisinopril (ZESTRIL) 5 MG tablet, Take 1 tablet (5 mg total) by mouth daily., Disp: 90 tablet, Rfl: 1   Multiple Vitamin (ONE-A-DAY MENS PO), Take 1 tablet by mouth daily., Disp: , Rfl:    omeprazole (PRILOSEC) 20 MG capsule, Take 1 capsule (20 mg total) by mouth daily., Disp: 90 capsule, Rfl: 3   Probiotic Product  (PROBIOTIC DAILY PO), Take by mouth daily., Disp: , Rfl:    UNABLE TO FIND, Apple cider vinegar Liquid. One table spoon a day, Disp: , Rfl:    atorvastatin (LIPITOR) 40 MG tablet, Take 1 tablet (40 mg total) by mouth daily., Disp: 90 tablet, Rfl: 3  No Known Allergies  I personally reviewed active problem list, medication list, allergies, notes from last encounter, lab results with the patient/caregiver today.   Review of Systems  HENT:  Negative for sore throat.   Respiratory:  Negative for cough and shortness of breath.   Gastrointestinal:  Positive for abdominal pain (abdominal discomfort). Negative for blood in stool, constipation, diarrhea, heartburn, nausea and vomiting.      Objective  Vitals:   06/01/22 1037  BP: 132/77  Pulse: 61  Temp: 98.7 F (37.1 C)  TempSrc: Oral  SpO2: 97%  Weight: 267 lb 6.4 oz (121.3 kg)    Body mass index is 33.78 kg/m.  Physical Exam Vitals reviewed.  Constitutional:      General: He is awake.     Appearance: Normal appearance. He is well-developed and well-groomed. He is obese.  Cardiovascular:     Rate and Rhythm: Normal rate and regular rhythm.     Pulses: Normal pulses.     Heart sounds: Normal heart sounds.  Pulmonary:     Effort: Pulmonary effort is normal.     Breath sounds: Normal breath sounds. No decreased air movement. No decreased breath sounds, wheezing, rhonchi or rales.  Abdominal:     General: Abdomen is flat. Bowel sounds are normal.     Palpations: Abdomen is soft.     Tenderness: There is no abdominal tenderness.  Musculoskeletal:     Right lower leg: No edema.     Left lower leg: No edema.  Neurological:     Mental Status: He is alert.  Psychiatric:        Behavior: Behavior is cooperative.      Recent Results (from the past 2160 hour(s))  CBC with Differential/Platelet     Status: None   Collection Time: 04/07/22  8:49 AM  Result Value Ref Range   WBC 5.5 3.4 - 10.8 x10E3/uL   RBC 4.57 4.14 -  5.80 x10E6/uL   Hemoglobin 14.4 13.0 - 17.7 g/dL   Hematocrit 42.2 37.5 - 51.0 %   MCV 92 79 - 97 fL   MCH 31.5 26.6 - 33.0 pg   MCHC 34.1 31.5 - 35.7 g/dL   RDW 12.6 11.6 - 15.4 %   Platelets 227 150 - 450 x10E3/uL   Neutrophils 58 Not Estab. %   Lymphs 28 Not Estab. %   Monocytes 9 Not Estab. %  Eos 3 Not Estab. %   Basos 1 Not Estab. %   Neutrophils Absolute 3.3 1.4 - 7.0 x10E3/uL   Lymphocytes Absolute 1.6 0.7 - 3.1 x10E3/uL   Monocytes Absolute 0.5 0.1 - 0.9 x10E3/uL   EOS (ABSOLUTE) 0.1 0.0 - 0.4 x10E3/uL   Basophils Absolute 0.0 0.0 - 0.2 x10E3/uL   Immature Granulocytes 1 Not Estab. %   Immature Grans (Abs) 0.0 0.0 - 0.1 x10E3/uL  Comprehensive metabolic panel     Status: None   Collection Time: 04/07/22  8:49 AM  Result Value Ref Range   Glucose 96 70 - 99 mg/dL   BUN 14 6 - 24 mg/dL   Creatinine, Ser 1.15 0.76 - 1.27 mg/dL   eGFR 73 >59 mL/min/1.73   BUN/Creatinine Ratio 12 9 - 20   Sodium 140 134 - 144 mmol/L   Potassium 4.3 3.5 - 5.2 mmol/L   Chloride 104 96 - 106 mmol/L   CO2 23 20 - 29 mmol/L   Calcium 9.5 8.7 - 10.2 mg/dL   Total Protein 6.6 6.0 - 8.5 g/dL   Albumin 4.5 3.8 - 4.9 g/dL    Comment:                **Effective April 19, 2022 Albumin reference interval**                  will be changing to:                             Age                  Male          Male                            0 -   7 days       3.6 - 4.9      3.6 - 4.9                            8 -  30 days       3.5 - 4.6      3.5 - 4.6                            1 -   6 months     3.7 - 4.8      3.7 - 4.8                     7 months -   2 years      4.0 - 5.0      4.0 - 5.0                            3 -   5 years      4.1 - 5.0      4.1 - 5.0                            6 -  12 years      4.2 - 5.0      4.2 - 5.0  13 -  30 years      4.3 - 5.2      4.0 - 5.0                           31 -  50 years      4.1 - 5.1      3.9 - 4.9                            51 -  60 years      3.8 - 4.9      3.8 - 4.9                           61 -  70 years      3.9 - 4.9      3.9 - 4.9                           71 -  80 years      3.8 - 4.8      3.8 - 4.'8                           8 1 ' -  89 years      3.7 - 4.7      3.7 - 4.7                           90 - 199 years      3.6 - 4.6      3.6 - 4.6    Globulin, Total 2.1 1.5 - 4.5 g/dL   Albumin/Globulin Ratio 2.1 1.2 - 2.2   Bilirubin Total 0.5 0.0 - 1.2 mg/dL   Alkaline Phosphatase 81 44 - 121 IU/L   AST 15 0 - 40 IU/L   ALT 22 0 - 44 IU/L  Lipid Panel w/o Chol/HDL Ratio     Status: None   Collection Time: 04/07/22  8:49 AM  Result Value Ref Range   Cholesterol, Total 109 100 - 199 mg/dL   Triglycerides 107 0 - 149 mg/dL   HDL 41 >39 mg/dL   VLDL Cholesterol Cal 20 5 - 40 mg/dL   LDL Chol Calc (NIH) 48 0 - 99 mg/dL  TSH     Status: None   Collection Time: 04/07/22  8:49 AM  Result Value Ref Range   TSH 3.450 0.450 - 4.500 uIU/mL  PSA     Status: None   Collection Time: 04/07/22  8:49 AM  Result Value Ref Range   Prostate Specific Ag, Serum 0.6 0.0 - 4.0 ng/mL    Comment: Roche ECLIA methodology. According to the American Urological Association, Serum PSA should decrease and remain at undetectable levels after radical prostatectomy. The AUA defines biochemical recurrence as an initial PSA value 0.2 ng/mL or greater followed by a subsequent confirmatory PSA value 0.2 ng/mL or greater. Values obtained with different assay methods or kits cannot be used interchangeably. Results cannot be interpreted as absolute evidence of the presence or absence of malignant disease.      PHQ2/9:    04/07/2022    8:17 AM 10/06/2021    9:02 AM 04/06/2021    8:09 AM 03/26/2020    2:46 PM 03/21/2019  2:53 PM  Depression screen PHQ 2/9  Decreased Interest 0 0 0 0 0  Down, Depressed, Hopeless 0 0 0 0 0  PHQ - 2 Score 0 0 0 0 0  Altered sleeping 0 0 0 0 0  Tired, decreased energy 0 0 0 0 0  Change in  appetite 0 0 0 0 0  Feeling bad or failure about yourself  0 0 0 0 0  Trouble concentrating 0 0 0 0 0  Moving slowly or fidgety/restless 0 0 0 0 0  Suicidal thoughts 0 0 0 0 0  PHQ-9 Score 0 0 0 0 0  Difficult doing work/chores Not difficult at all Not difficult at all Not difficult at all        Fall Risk:    04/07/2022    8:17 AM 10/06/2021    9:02 AM 04/06/2021    8:08 AM 09/25/2020    2:14 PM 03/26/2020    2:46 PM  Fall Risk   Falls in the past year? 0 0 0 0 0  Number falls in past yr: 0 0 0 0 0  Injury with Fall? 0 0 0 0 0  Risk for fall due to : No Fall Risks No Fall Risks No Fall Risks    Follow up Falls evaluation completed Falls evaluation completed Falls evaluation completed Falls evaluation completed       Functional Status Survey:      Assessment & Plan  Problem List Items Addressed This Visit   None Visit Diagnoses     Abdominal discomfort, epigastric    -  Primary Acute, ongoing Reports abdominal discomfort for about one week - has been taking Omeprazole, Zantac, Mylanta, and TUMS for control Reports some improvement over the weekend but symptoms returned yesterday Recommend continuing to use Omeprazole daily with TUMS and Mylanta for breakthrough symptoms Recommend small meals and snacks of bland nature for the next 2 weeks with slow reintroduction of normal diet as tolerated after that Can try to taper off Omeprazole after about 4 weeks without symptoms If not improving in 2 weeks may need to return to office for H.pylori testing/ referral to GI to rule out gastritis and ulcers.  Follow up as needed.         No follow-ups on file.   I, Venora Kautzman E Caeley Dohrmann, PA-C, have reviewed all documentation for this visit. The documentation on 06/01/22 for the exam, diagnosis, procedures, and orders are all accurate and complete.   Talitha Givens, MHS, PA-C [facility]

## 2022-06-01 NOTE — Patient Instructions (Addendum)
Please do the following to help with the gastritis   Continue with small meals and snacks- bland is preferred and then in about 2 weeks you can try to reintroduce normal diet  Stay well hydrated  Take the Omeprazole every day, If you have flares you can use the Mylanta or TUMS as preferred  If this is not improving in about 2 weeks let us know so we can set up testing for H. Pylori and evaluate for stomach ulcers

## 2022-06-29 DIAGNOSIS — L988 Other specified disorders of the skin and subcutaneous tissue: Secondary | ICD-10-CM | POA: Diagnosis not present

## 2022-06-29 DIAGNOSIS — D235 Other benign neoplasm of skin of trunk: Secondary | ICD-10-CM | POA: Diagnosis not present

## 2022-08-27 ENCOUNTER — Ambulatory Visit (INDEPENDENT_AMBULATORY_CARE_PROVIDER_SITE_OTHER): Payer: BC Managed Care – PPO

## 2022-08-27 ENCOUNTER — Ambulatory Visit: Payer: BC Managed Care – PPO | Attending: Cardiology | Admitting: Cardiology

## 2022-08-27 ENCOUNTER — Encounter: Payer: Self-pay | Admitting: Cardiology

## 2022-08-27 VITALS — BP 125/80 | HR 66 | Ht 74.0 in | Wt 270.4 lb

## 2022-08-27 DIAGNOSIS — I459 Conduction disorder, unspecified: Secondary | ICD-10-CM | POA: Diagnosis not present

## 2022-08-27 DIAGNOSIS — I77810 Thoracic aortic ectasia: Secondary | ICD-10-CM | POA: Diagnosis not present

## 2022-08-27 DIAGNOSIS — I10 Essential (primary) hypertension: Secondary | ICD-10-CM | POA: Diagnosis not present

## 2022-08-27 DIAGNOSIS — E7800 Pure hypercholesterolemia, unspecified: Secondary | ICD-10-CM

## 2022-08-27 NOTE — Progress Notes (Signed)
Cardiology Office Note:    Date:  08/27/2022   ID:  Micheal Khan, DOB [birth year], MRN [id number]  PCP:  Charlynne Cousins, MD (Inactive)  [facility] Cardiologist:  Kate Sable, MD  [place] Electrophysiologist:  None   Referring MD: No ref. provider found   Chief Complaint  Patient presents with   Follow-up    PVC's come and go,      History of Present Illness:    Micheal Khan is a 61 y.o. male with a hx of hypertension, ascending aortic dilatation who presents for follow-up.   Patient presents due to skipped heartbeats ongoing over the past month or so.  Symptoms occur about 3 days in a week.  He drinks about 1 cup of coffee weekly, denies drinking energy drinks.  He was diagnosed with PVCs about 4 5 years ago, symptoms are very similar to when he was diagnosed.  Diagnosed with vitamin D deficiency at that time, symptoms improved with taking vitamin D.  During this incident, he started taking vitamin D, overall symptoms of skipped heartbeats have improved.  Otherwise feels okay, denies chest pain or shortness of breath.  Blood pressure cholesterol adequately controlled.  Prior notes Echo 10/2020 EF 55 to 60%.  Moderate ascending aorta dilatation 45 mm. Coronary CTA 09/2020 calcium score 0, no evidence of CAD Father passed from MI in his 67s   Past Medical History:  Diagnosis Date   Cancer ([place]) 07/2004   testicular-surgery and chemo   Complication of anesthesia    nausea with colonoscopy in [place] 9 years ago   Family history of breast cancer    Family history of colon cancer    Family history of melanoma    GERD (gastroesophageal reflux disease)    Hypertension    PONV (postoperative nausea and vomiting)     Past Surgical History:  Procedure Laterality Date   COLONOSCOPY WITH PROPOFOL     COLONOSCOPY WITH PROPOFOL N/A 01/08/2020   Procedure: COLONOSCOPY WITH PROPOFOL;  Surgeon: Jonathon Bellows, MD;  Location: [facility] ENDOSCOPY;  Service:  Gastroenterology;  Laterality: N/A;   COLONOSCOPY WITH PROPOFOL N/A 01/16/2021   Procedure: COLONOSCOPY WITH PROPOFOL;  Surgeon: Jonathon Bellows, MD;  Location: [facility] ENDOSCOPY;  Service: Gastroenterology;  Laterality: N/A;   ESOPHAGOGASTRODUODENOSCOPY (EGD) WITH PROPOFOL     TESTICLE REMOVAL Left 07/2004    Current Medications: Current Meds  Medication Sig   atorvastatin (LIPITOR) 40 MG tablet Take 1 tablet (40 mg total) by mouth daily.   cholecalciferol (VITAMIN D) 1000 units tablet Take 8,000 Units by mouth daily. 2000 four times daily   lisinopril (ZESTRIL) 5 MG tablet Take 1 tablet (5 mg total) by mouth daily.   Multiple Vitamin (ONE-A-DAY MENS PO) Take 1 tablet by mouth daily.   omeprazole (PRILOSEC) 20 MG capsule Take 1 capsule (20 mg total) by mouth daily.   Probiotic Product (PROBIOTIC DAILY PO) Take by mouth daily.     Allergies:   Patient has no known allergies.   Social History   Socioeconomic History   Marital status: Married    Spouse name: Not on file   Number of children: Not on file   Years of education: Not on file   Highest education level: Not on file  Occupational History   Not on file  Tobacco Use   Smoking status: Never   Smokeless tobacco: Never  Vaping Use   Vaping Use: Never used  Substance and Sexual Activity   Alcohol use: No  Alcohol/week: 0.0 standard drinks of alcohol   Drug use: No   Sexual activity: Yes  Other Topics Concern   Not on file  Social History Narrative   Not on file   Social Determinants of Health   Financial Resource Strain: Not on file  Food Insecurity: Not on file  Transportation Needs: Not on file  Physical Activity: Not on file  Stress: Not on file  Social Connections: Not on file     Family History: The patient's family history includes Breast cancer (age of onset: 71) in his maternal aunt; Cancer in his maternal grandfather; Colon cancer (age of onset: 53) in his brother; Colon polyps in his paternal uncle; Heart  disease in his father and paternal grandfather; Melanoma (age of onset: 40) in his mother; Stroke in his paternal grandmother.  ROS:   Please see the history of present illness.     All other systems reviewed and are negative.  EKGs/Labs/Other Studies Reviewed:    The following studies were reviewed today:   EKG:  EKG is  ordered today.  The ekg ordered today demonstrates sinus rhythm.  Recent Labs: 04/07/2022: ALT 22; BUN 14; Creatinine, Ser 1.15; Hemoglobin 14.4; Platelets 227; Potassium 4.3; Sodium 140; TSH 3.450  Recent Lipid Panel    Component Value Date/Time   CHOL 109 04/07/2022 0849   TRIG 107 04/07/2022 0849   HDL 41 04/07/2022 0849   CHOLHDL 5.5 (H) 10/20/2021 1000   LDLCALC 48 04/07/2022 0849     Risk Assessment/Calculations:      Physical Exam:    VS:  BP 125/80 (BP Location: Left Arm, Patient Position: Sitting, Cuff Size: Normal)   Pulse 66   Ht '6\' 2"'$  (1.88 m)   Wt 270 lb 6.4 oz (122.7 kg)   SpO2 95%   BMI 34.72 kg/m     Wt Readings from Last 3 Encounters:  08/27/22 270 lb 6.4 oz (122.7 kg)  06/01/22 267 lb 6.4 oz (121.3 kg)  04/07/22 278 lb 6.4 oz (126.3 kg)     GEN:  Well nourished, well developed in no acute distress HEENT: Normal NECK: No JVD; No carotid bruits CARDIAC: RRR, no murmurs, rubs, gallops RESPIRATORY:  Clear to auscultation without rales, wheezing or rhonchi  ABDOMEN: Soft, non-tender, non-distended MUSCULOSKELETAL:  No edema; No deformity  SKIN: Warm and dry NEUROLOGIC:  Alert and oriented x 3 PSYCHIATRIC:  Normal affect   ASSESSMENT:    1. Skipped heart beats   2. Primary hypertension   3. Ascending aorta dilatation (HCC)   4. Pure hypercholesterolemia     PLAN:    In order of problems listed above:  Skipped heartbeat, history of PVCs, place cardiac monitor x2 weeks to evaluate any significant arrhythmias and document PVC burden if any.  We will consider beta-blockers if symptoms get worse or significant arrhythmia  noted.  Symptoms have improved with taking vitamin D supplements. hypertension, BP controlled.  Continue lisinopril 5 mg daily Ascending aorta dilatation, repeat echo 10/2021 shows mild aortic root and ascending aorta dilatation.  Aortic root 42 mm, ascending aorta 4 mm.  Findings stable from prior.  Plan repeat echo in 2 years. Hyperlipidemia, cholesterol controlled, continue Lipitor 40 mg daily.  Follow-up after cardiac monitor   Shared Decision Making/Informed Consent       Medication Adjustments/Labs and Tests Ordered: Current medicines are reviewed at length with the patient today.  Concerns regarding medicines are outlined above.  Orders Placed This Encounter  Procedures   LONG TERM  MONITOR (3-14 DAYS)   EKG 12-Lead   No orders of the defined types were placed in this encounter.   Patient Instructions  Medication Instructions:   Your physician recommends that you continue on your current medications as directed. Please refer to the Current Medication list given to you today.  *If you need a refill on your cardiac medications before your next appointment, please call your pharmacy*   Testing/Procedures:  Your physician has recommended that you wear a Zio monitor for 2 weeks.   This monitor is a medical device that records the heart's electrical activity. Doctors most often use these monitors to diagnose arrhythmias. Arrhythmias are problems with the speed or rhythm of the heartbeat. The monitor is a small device applied to your chest. You can wear one while you do your normal daily activities. While wearing this monitor if you have any symptoms to push the button and record what you felt. Once you have worn this monitor for the period of time provider prescribed (Usually 14 days), you will return the monitor device in the postage paid box. Once it is returned they will download the data collected and provide [place] with a report which the provider will then review and we will call  you with those results. Important tips:  Avoid showering during the first 24 hours of wearing the monitor. Avoid excessive sweating to help maximize wear time. Do not submerge the device, no hot tubs, and no swimming pools. Keep any lotions or oils away from the patch. After 24 hours you may shower with the patch on. Take brief showers with your back facing the shower head.  Do not remove patch once it has been placed because that will interrupt data and decrease adhesive wear time. Push the button when you have any symptoms and write down what you were feeling. Once you have completed wearing your monitor, remove and place into box which has postage paid and place in your outgoing mailbox.  If for some reason you have misplaced your box then call our office and we can provide another box and/or mail it off for you.      Follow-Up: At [facility], you and your health needs are our priority.  As part of our continuing mission to provide you with exceptional heart care, we have created designated Provider Care Teams.  These Care Teams include your primary Cardiologist (physician) and Advanced Practice Providers (APPs -  Physician Assistants and Nurse Practitioners) who all work together to provide you with the care you need, when you need it.  We recommend signing up for the patient portal called "MyChart".  Sign up information is provided on this After Visit Summary.  MyChart is used to connect with patients for Virtual Visits (Telemedicine).  Patients are able to view lab/test results, encounter notes, upcoming appointments, etc.  Non-urgent messages can be sent to your provider as well.   To learn more about what you can do with MyChart, go to [email or web address].    Your next appointment:   6-8 week(s)  The format for your next appointment:   In Person  Provider:   You may see Kate Sable, MD or one of the following Advanced Practice Providers on your designated Care  Team:   Murray Hodgkins, NP Christell Faith, PA-C Cadence Kathlen Mody, PA-C Gerrie Nordmann, NP    Other Instructions   Important Information About Sugar         Signed, Kate Sable, MD  08/27/2022 4:20 PM  [facility]

## 2022-08-27 NOTE — Patient Instructions (Signed)
Medication Instructions:   Your physician recommends that you continue on your current medications as directed. Please refer to the Current Medication list given to you today.  *If you need a refill on your cardiac medications before your next appointment, please call your pharmacy*   Testing/Procedures:  Your physician has recommended that you wear a Zio monitor for 2 weeks.   This monitor is a medical device that records the heart's electrical activity. Doctors most often use these monitors to diagnose arrhythmias. Arrhythmias are problems with the speed or rhythm of the heartbeat. The monitor is a small device applied to your chest. You can wear one while you do your normal daily activities. While wearing this monitor if you have any symptoms to push the button and record what you felt. Once you have worn this monitor for the period of time provider prescribed (Usually 14 days), you will return the monitor device in the postage paid box. Once it is returned they will download the data collected and provide [place] with a report which the provider will then review and we will call you with those results. Important tips:  Avoid showering during the first 24 hours of wearing the monitor. Avoid excessive sweating to help maximize wear time. Do not submerge the device, no hot tubs, and no swimming pools. Keep any lotions or oils away from the patch. After 24 hours you may shower with the patch on. Take brief showers with your back facing the shower head.  Do not remove patch once it has been placed because that will interrupt data and decrease adhesive wear time. Push the button when you have any symptoms and write down what you were feeling. Once you have completed wearing your monitor, remove and place into box which has postage paid and place in your outgoing mailbox.  If for some reason you have misplaced your box then call our office and we can provide another box and/or mail it off for you.       Follow-Up: At [facility], you and your health needs are our priority.  As part of our continuing mission to provide you with exceptional heart care, we have created designated Provider Care Teams.  These Care Teams include your primary Cardiologist (physician) and Advanced Practice Providers (APPs -  Physician Assistants and Nurse Practitioners) who all work together to provide you with the care you need, when you need it.  We recommend signing up for the patient portal called "MyChart".  Sign up information is provided on this After Visit Summary.  MyChart is used to connect with patients for Virtual Visits (Telemedicine).  Patients are able to view lab/test results, encounter notes, upcoming appointments, etc.  Non-urgent messages can be sent to your provider as well.   To learn more about what you can do with MyChart, go to [email or web address].    Your next appointment:   6-8 week(s)  The format for your next appointment:   In Person  Provider:   You may see Kate Sable, MD or one of the following Advanced Practice Providers on your designated Care Team:   Murray Hodgkins, NP Christell Faith, PA-C Cadence Kathlen Mody, PA-C Gerrie Nordmann, NP    Other Instructions   Important Information About Sugar

## 2022-08-30 DIAGNOSIS — I459 Conduction disorder, unspecified: Secondary | ICD-10-CM | POA: Diagnosis not present

## 2022-09-17 DIAGNOSIS — I459 Conduction disorder, unspecified: Secondary | ICD-10-CM | POA: Diagnosis not present

## 2022-09-21 ENCOUNTER — Telehealth: Payer: Self-pay | Admitting: Cardiology

## 2022-09-21 NOTE — Telephone Encounter (Signed)
Pt returning call regarding test results. Please advise ?

## 2022-09-21 NOTE — Telephone Encounter (Signed)
Patient made aware of results and verbalized understanding.   Kate Sable, MD 09/20/2022  5:03 PM EST     Occasional PVCs noted, 2.6% burden. Paroxysmal SVT associated with patient triggered events. Overall no significant sustained arrhythmias noted. If symptoms persist, will consider beta-blocker.   He will call back if he decides on a beta blocker.

## 2022-09-23 DIAGNOSIS — H43812 Vitreous degeneration, left eye: Secondary | ICD-10-CM | POA: Diagnosis not present

## 2022-09-24 DIAGNOSIS — D2330 Other benign neoplasm of skin of unspecified part of face: Secondary | ICD-10-CM | POA: Diagnosis not present

## 2022-10-07 ENCOUNTER — Encounter: Payer: Self-pay | Admitting: Physician Assistant

## 2022-10-07 ENCOUNTER — Ambulatory Visit (INDEPENDENT_AMBULATORY_CARE_PROVIDER_SITE_OTHER): Payer: BC Managed Care – PPO | Admitting: Physician Assistant

## 2022-10-07 VITALS — BP 126/70 | HR 60 | Temp 98.6°F | Wt 273.8 lb

## 2022-10-07 DIAGNOSIS — K219 Gastro-esophageal reflux disease without esophagitis: Secondary | ICD-10-CM | POA: Diagnosis not present

## 2022-10-07 DIAGNOSIS — I10 Essential (primary) hypertension: Secondary | ICD-10-CM

## 2022-10-07 DIAGNOSIS — E782 Mixed hyperlipidemia: Secondary | ICD-10-CM | POA: Diagnosis not present

## 2022-10-07 DIAGNOSIS — Z8639 Personal history of other endocrine, nutritional and metabolic disease: Secondary | ICD-10-CM

## 2022-10-07 MED ORDER — ATORVASTATIN CALCIUM 40 MG PO TABS: 40.0000 mg | ORAL_TABLET | Freq: Every day | ORAL | 1 refills | Status: DC

## 2022-10-07 MED ORDER — LISINOPRIL 5 MG PO TABS: 5.0000 mg | ORAL_TABLET | Freq: Every day | ORAL | 1 refills | Status: DC

## 2022-10-07 NOTE — Progress Notes (Signed)
Established Patient Office Visit  Name: Micheal Khan   MRN: [id number]    DOB: Oct 10, 1961   Date:10/07/2022  Today's Provider: Talitha Givens, MHS, PA-C Introduced myself to the patient as a PA-C and provided education on APPs in clinical practice.         Subjective  Chief Complaint  Chief Complaint  Patient presents with   Hyperlipidemia   Hypertension    HPI   HYPERTENSION / HYPERLIPIDEMIA Satisfied with current treatment? yes Duration of hypertension: years BP monitoring frequency: a few times a month BP range: 120s/70s BP medication side effects: no Past BP meds: lisinopril Duration of hyperlipidemia: years Cholesterol medication side effects: no Cholesterol supplements: none Past cholesterol medications: atorvastain (lipitor) Medication compliance: excellent compliance Aspirin: no Recent stressors: no Recurrent headaches: no Visual changes: no Palpitations: no Dyspnea: no Chest pain: no Lower extremity edema: no Dizzy/lightheaded: no   Reports he has been having PVCs intermittently  He has seen his Cardiologist about this- has had EKG and monitor to confirm   GERD Reports he has been taking Omeprazole for several months- daily Has tried to reduce his daily use to every other day until a gastritis flare in Aug  Reports he would still like to try to switch over to another agent to prevent complications.      Patient Active Problem List   Diagnosis Date Noted   Chest pain 09/08/2020   Personal history of colonic polyps 01/31/2020   Family history of colon cancer    Family history of melanoma    Family history of breast cancer    Hyperlipidemia 09/20/2019   Anxiety 07/23/2018   Obesity (BMI 35.0-39.9 without comorbidity) 03/15/2018   Abdominal pain 05/13/2017   Testicular cancer ([place]) 05/23/2015   GERD (gastroesophageal reflux disease) 05/23/2015   Essential hypertension, benign 05/23/2015    Past Surgical History:  Procedure  Laterality Date   COLONOSCOPY WITH PROPOFOL     COLONOSCOPY WITH PROPOFOL N/A 01/08/2020   Procedure: COLONOSCOPY WITH PROPOFOL;  Surgeon: Jonathon Bellows, MD;  Location: [facility] ENDOSCOPY;  Service: Gastroenterology;  Laterality: N/A;   COLONOSCOPY WITH PROPOFOL N/A 01/16/2021   Procedure: COLONOSCOPY WITH PROPOFOL;  Surgeon: Jonathon Bellows, MD;  Location: [facility] ENDOSCOPY;  Service: Gastroenterology;  Laterality: N/A;   ESOPHAGOGASTRODUODENOSCOPY (EGD) WITH PROPOFOL     TESTICLE REMOVAL Left 07/2004    Family History  Problem Relation Age of Onset   Melanoma Mother 20       metastatic to brain   Heart disease Father        MI   Colon cancer Brother 56   Cancer Maternal Grandfather        unknown type, maybe brain tumor   Stroke Paternal Grandmother    Heart disease Paternal Grandfather    Breast cancer Maternal Aunt 73   Colon polyps Paternal Uncle        less than 10 cumulative    Social History   Tobacco Use   Smoking status: Never   Smokeless tobacco: Never  Substance Use Topics   Alcohol use: No    Alcohol/week: 0.0 standard drinks of alcohol     Current Outpatient Medications:    cholecalciferol (VITAMIN D) 1000 units tablet, Take 8,000 Units by mouth daily. 2000 four times daily, Disp: , Rfl:    Multiple Vitamin (ONE-A-DAY MENS PO), Take 1 tablet by mouth daily., Disp: , Rfl:    omeprazole (PRILOSEC) 20 MG capsule, Take 1 capsule (  20 mg total) by mouth daily., Disp: 90 capsule, Rfl: 3   Probiotic Product (PROBIOTIC DAILY PO), Take by mouth daily., Disp: , Rfl:    atorvastatin (LIPITOR) 40 MG tablet, Take 1 tablet (40 mg total) by mouth daily., Disp: 90 tablet, Rfl: 1   lisinopril (ZESTRIL) 5 MG tablet, Take 1 tablet (5 mg total) by mouth daily., Disp: 90 tablet, Rfl: 1  No Known Allergies  I personally reviewed active problem list, medication list, allergies, health maintenance, notes from last encounter, lab results with the patient/caregiver today.   Review of Systems   Eyes:  Negative for blurred vision and double vision.  Respiratory:  Negative for shortness of breath and wheezing.   Cardiovascular:  Positive for palpitations (Reports he was having PVCs recurrently). Negative for chest pain and leg swelling.  Musculoskeletal:  Negative for myalgias.  Neurological:  Negative for dizziness and headaches.      Objective  Vitals:   10/07/22 0905  BP: 126/70  Pulse: 60  Temp: 98.6 F (37 C)  TempSrc: Oral  SpO2: 95%  Weight: 273 lb 12.8 oz (124.2 kg)    Body mass index is 35.15 kg/m.  Physical Exam Vitals reviewed.  Constitutional:      General: He is awake.     Appearance: Normal appearance. He is well-developed and well-groomed.  HENT:     Head: Normocephalic and atraumatic.  Eyes:     General: Lids are normal. Gaze aligned appropriately.     Extraocular Movements: Extraocular movements intact.     Conjunctiva/sclera: Conjunctivae normal.  Neck:     Vascular: No carotid bruit.  Cardiovascular:     Rate and Rhythm: Normal rate and regular rhythm.     Pulses: Normal pulses.     Heart sounds: Normal heart sounds. No murmur heard.    No friction rub. No gallop.  Pulmonary:     Effort: Pulmonary effort is normal.     Breath sounds: Normal breath sounds. No decreased air movement. No decreased breath sounds, wheezing, rhonchi or rales.  Musculoskeletal:     Cervical back: Normal range of motion.     Right lower leg: No edema.     Left lower leg: No edema.  Neurological:     General: No focal deficit present.     Mental Status: He is alert and oriented to person, place, and time.     GCS: GCS eye subscore is 4. GCS verbal subscore is 5. GCS motor subscore is 6.     Cranial Nerves: Cranial nerves 2-12 are intact. No dysarthria or facial asymmetry.     Motor: Motor function is intact.     Coordination: Coordination is intact.     Gait: Gait is intact.  Psychiatric:        Attention and Perception: Attention and perception normal.         Mood and Affect: Mood and affect normal.        Speech: Speech normal.        Behavior: Behavior normal. Behavior is cooperative.      No results found for this or any previous visit (from the past 2160 hour(s)).   PHQ2/9:    10/07/2022    9:08 AM 04/07/2022    8:17 AM 10/06/2021    9:02 AM 04/06/2021    8:09 AM 03/26/2020    2:46 PM  Depression screen PHQ 2/9  Decreased Interest 0 0 0 0 0  Down, Depressed, Hopeless 0 0 0 0 0  PHQ - 2 Score 0 0 0 0 0  Altered sleeping 0 0 0 0 0  Tired, decreased energy 0 0 0 0 0  Change in appetite 0 0 0 0 0  Feeling bad or failure about yourself  0 0 0 0 0  Trouble concentrating 0 0 0 0 0  Moving slowly or fidgety/restless 0 0 0 0 0  Suicidal thoughts 0 0 0 0 0  PHQ-9 Score 0 0 0 0 0  Difficult doing work/chores Not difficult at all Not difficult at all Not difficult at all Not difficult at all       Fall Risk:    10/07/2022    9:08 AM 04/07/2022    8:17 AM 10/06/2021    9:02 AM 04/06/2021    8:08 AM 09/25/2020    2:14 PM  Fall Risk   Falls in the past year? 0 0 0 0 0  Number falls in past yr: 0 0 0 0 0  Injury with Fall? 0 0 0 0 0  Risk for fall due to : No Fall Risks No Fall Risks No Fall Risks No Fall Risks   Follow up _0       Functional Status Survey:      Assessment & Plan  Problem List Items Addressed This Visit       Cardiovascular and Mediastinum   Essential hypertension, benign - Primary    Chronic, ongoing, appears well controlled  Currently taking Lisinopril 5 mg PO QD - appears to be tolerating well  Continue current medications Will continue to manage HLD as well with Atorvastatin  Follow up in 6 months or sooner if concerns arise       Relevant Medications   lisinopril (ZESTRIL) 5 MG tablet   atorvastatin (LIPITOR) 40 MG tablet   Other Relevant Orders   Comp Met  (CMET)   CBC w/Diff     Digestive   GERD (gastroesophageal reflux disease)    Chronic, ongoing Appears to be well controlled with Omeprazole but he has been taking this for sometime and has been trying to taper to at least every other day Will try to replace with Famotidine 10 mg PO QD to BID PRN  Reviewed using TUMS or Pepto for acute flares Follow up as needed for concerns         Other   Hyperlipidemia    Chronic, ongoing  Appears well controlled with Atorvastatin 40 mg PO QD and he is tolerating well Continue current management Recommend diet and exercise to further manage Lipid panel today for monitoring Follow up in 6 months or sooner if concerns arise      Relevant Medications   lisinopril (ZESTRIL) 5 MG tablet   atorvastatin (LIPITOR) 40 MG tablet   Other Relevant Orders   Lipid Profile   Other Visit Diagnoses     H/O vitamin D deficiency       Relevant Orders   Vitamin D (25 hydroxy)        Return in about 6 months (around 04/08/2023) for HTN, HLD, CPE .   I, Tranquilino Fischler E Keanu Frickey, PA-C, have reviewed all documentation for this visit. The documentation on 10/07/22 for the exam, diagnosis, procedures, and orders are all accurate and complete.   Talitha Givens, MHS, PA-C [facility]

## 2022-10-07 NOTE — Assessment & Plan Note (Signed)
Chronic, ongoing  Appears well controlled with Atorvastatin 40 mg PO QD and he is tolerating well Continue current management Recommend diet and exercise to further manage Lipid panel today for monitoring Follow up in 6 months or sooner if concerns arise

## 2022-10-07 NOTE — Patient Instructions (Addendum)
  I would recommend trying to switch to Pepcid 10 mg one to two times per day  instead of the Omeprazole   You can continue to use TUMS or Pepto as needed for acute symptoms and let us know how this is doing to control your heartburn

## 2022-10-07 NOTE — Assessment & Plan Note (Signed)
Chronic, ongoing Appears to be well controlled with Omeprazole but he has been taking this for sometime and has been trying to taper to at least every other day Will try to replace with Famotidine 10 mg PO QD to BID PRN  Reviewed using TUMS or Pepto for acute flares Follow up as needed for concerns

## 2022-10-07 NOTE — Assessment & Plan Note (Signed)
Chronic, ongoing, appears well controlled  Currently taking Lisinopril 5 mg PO QD - appears to be tolerating well  Continue current medications Will continue to manage HLD as well with Atorvastatin  Follow up in 6 months or sooner if concerns arise

## 2022-10-08 LAB — CBC WITH DIFFERENTIAL/PLATELET
Basophils Absolute: 0 10*3/uL (ref 0.0–0.2)
Basos: 1 %
EOS (ABSOLUTE): 0.2 10*3/uL (ref 0.0–0.4)
Eos: 3 %
Hematocrit: 43.2 % (ref 37.5–51.0)
Hemoglobin: 14.6 g/dL (ref 13.0–17.7)
Immature Grans (Abs): 0 10*3/uL (ref 0.0–0.1)
Immature Granulocytes: 1 %
Lymphocytes Absolute: 1.5 10*3/uL (ref 0.7–3.1)
Lymphs: 24 %
MCH: 31.1 pg (ref 26.6–33.0)
MCHC: 33.8 g/dL (ref 31.5–35.7)
MCV: 92 fL (ref 79–97)
Monocytes Absolute: 0.6 10*3/uL (ref 0.1–0.9)
Monocytes: 10 %
Neutrophils Absolute: 4.2 10*3/uL (ref 1.4–7.0)
Neutrophils: 61 %
Platelets: 246 10*3/uL (ref 150–450)
RBC: 4.7 x10E6/uL (ref 4.14–5.80)
RDW: 12.4 % (ref 11.6–15.4)
WBC: 6.6 10*3/uL (ref 3.4–10.8)

## 2022-10-08 LAB — COMPREHENSIVE METABOLIC PANEL
ALT: 33 IU/L (ref 0–44)
AST: 22 IU/L (ref 0–40)
Albumin/Globulin Ratio: 2.5 — ABNORMAL HIGH (ref 1.2–2.2)
Albumin: 4.7 g/dL (ref 3.8–4.9)
Alkaline Phosphatase: 95 IU/L (ref 44–121)
BUN/Creatinine Ratio: 13 (ref 9–20)
BUN: 14 mg/dL (ref 6–24)
Bilirubin Total: 0.4 mg/dL (ref 0.0–1.2)
CO2: 25 mmol/L (ref 20–29)
Calcium: 9.6 mg/dL (ref 8.7–10.2)
Chloride: 103 mmol/L (ref 96–106)
Creatinine, Ser: 1.05 mg/dL (ref 0.76–1.27)
Globulin, Total: 1.9 g/dL (ref 1.5–4.5)
Glucose: 96 mg/dL (ref 70–99)
Potassium: 4.4 mmol/L (ref 3.5–5.2)
Sodium: 142 mmol/L (ref 134–144)
Total Protein: 6.6 g/dL (ref 6.0–8.5)
eGFR: 82 mL/min/{1.73_m2} (ref >59)

## 2022-10-08 LAB — LIPID PANEL
Chol/HDL Ratio: 2.9 ratio (ref 0.0–5.0)
Cholesterol, Total: 127 mg/dL (ref 100–199)
HDL: 44 mg/dL (ref >39)
LDL Chol Calc (NIH): 61 mg/dL (ref 0–99)
Triglycerides: 123 mg/dL (ref 0–149)
VLDL Cholesterol Cal: 22 mg/dL (ref 5–40)

## 2022-10-08 LAB — VITAMIN D 25 HYDROXY (VIT D DEFICIENCY, FRACTURES): Vit D, 25-Hydroxy: 48.1 ng/mL (ref 30.0–100.0)

## 2022-10-22 ENCOUNTER — Ambulatory Visit: Payer: BC Managed Care – PPO | Admitting: Cardiology

## 2022-10-25 DIAGNOSIS — B309 Viral conjunctivitis, unspecified: Secondary | ICD-10-CM | POA: Diagnosis not present

## 2022-11-10 ENCOUNTER — Encounter: Payer: Self-pay | Admitting: Cardiology

## 2022-11-10 ENCOUNTER — Ambulatory Visit: Payer: BC Managed Care – PPO | Attending: Cardiology | Admitting: Cardiology

## 2022-11-10 VITALS — BP 140/80 | HR 66 | Ht 74.0 in | Wt 274.1 lb

## 2022-11-10 DIAGNOSIS — I10 Essential (primary) hypertension: Secondary | ICD-10-CM | POA: Diagnosis not present

## 2022-11-10 DIAGNOSIS — I77810 Thoracic aortic ectasia: Secondary | ICD-10-CM

## 2022-11-10 DIAGNOSIS — E7800 Pure hypercholesterolemia, unspecified: Secondary | ICD-10-CM

## 2022-11-10 DIAGNOSIS — I493 Ventricular premature depolarization: Secondary | ICD-10-CM

## 2022-11-10 NOTE — Progress Notes (Signed)
Cardiology Office Note:    Date:  11/10/2022   ID:  Micheal Khan, DOB 06/26/1961, MRN [id number]  PCP:  Jon Billings, NP  [facility] Cardiologist:  Kate Sable, MD  [place] Electrophysiologist:  None   Referring MD: Jon Billings, NP   Chief Complaint  Patient presents with   6-8 week follow up     Discuss Zio monitor results. Medications reviewed by the patient verbally.      History of Present Illness:    Micheal Khan is a 62 y.o. male with a hx of hypertension, ascending aortic dilatation, PVCs who presents for follow-up.   Previously seen for skipped heartbeats.  He has a history of PVCs.  Cardiac monitor was placed to evaluate any significant arrhythmias and document PVC burden.  Over the past month or so, symptoms have improved.  Blood pressure is usually well-controlled at home.  Has no new concerns at this time.  Prior notes Cardiac monitor 09/2019 20 paroxysmal SVT, occasional PVCs 2.6% burden. Echo 10/2020 EF 55 to 60%.  Moderate ascending aorta dilatation 45 mm. Coronary CTA 09/2020 calcium score 0, no evidence of CAD Father passed from MI in his 49s   Past Medical History:  Diagnosis Date   Cancer ([place]) 07/2004   testicular-surgery and chemo   Complication of anesthesia    nausea with colonoscopy in [place] 9 years ago   Family history of breast cancer    Family history of colon cancer    Family history of melanoma    GERD (gastroesophageal reflux disease)    Hypertension    PONV (postoperative nausea and vomiting)     Past Surgical History:  Procedure Laterality Date   COLONOSCOPY WITH PROPOFOL     COLONOSCOPY WITH PROPOFOL N/A 01/08/2020   Procedure: COLONOSCOPY WITH PROPOFOL;  Surgeon: Jonathon Bellows, MD;  Location[facility] ENDOSCOPY;  Service: Gastroenterology;  Laterality: N/A;   COLONOSCOPY WITH PROPOFOL N/A 01/16/2021   Procedure: COLONOSCOPY WITH PROPOFOL;  Surgeon: Jonathon Bellows, MD;  Location: [facility] ENDOSCOPY;  Service:  Gastroenterology;  Laterality: N/A;   ESOPHAGOGASTRODUODENOSCOPY (EGD) WITH PROPOFOL     TESTICLE REMOVAL Left 07/2004    Current Medications: Current Meds  Medication Sig   atorvastatin (LIPITOR) 40 MG tablet Take 1 tablet (40 mg total) by mouth daily.   cholecalciferol (VITAMIN D) 1000 units tablet Take 8,000 Units by mouth daily. 2000 four times daily   famotidine (PEPCID) 10 MG tablet Take 10 mg by mouth every other day.   lisinopril (ZESTRIL) 5 MG tablet Take 1 tablet (5 mg total) by mouth daily.   Multiple Vitamin (ONE-A-DAY MENS PO) Take 1 tablet by mouth daily.   omeprazole (PRILOSEC) 20 MG capsule Take 1 capsule (20 mg total) by mouth daily.   Probiotic Product (PROBIOTIC DAILY PO) Take by mouth daily.     Allergies:   Patient has no known allergies.   Social History   Socioeconomic History   Marital status: Married    Spouse name: Not on file   Number of children: Not on file   Years of education: Not on file   Highest education level: Not on file  Occupational History   Not on file  Tobacco Use   Smoking status: Never   Smokeless tobacco: Never  Vaping Use   Vaping Use: Never used  Substance and Sexual Activity   Alcohol use: No    Alcohol/week: 0.0 standard drinks of alcohol   Drug use: No   Sexual activity:  Yes  Other Topics Concern   Not on file  Social History Narrative   Not on file   Social Determinants of Health   Financial Resource Strain: Not on file  Food Insecurity: Not on file  Transportation Needs: Not on file  Physical Activity: Not on file  Stress: Not on file  Social Connections: Not on file     Family History: The patient's family history includes Breast cancer (age of onset: 43) in his maternal aunt; Cancer in his maternal grandfather; Colon cancer (age of onset: 38) in his brother; Colon polyps in his paternal uncle; Heart disease in his father and paternal grandfather; Melanoma (age of onset: 89) in his mother; Stroke in his  paternal grandmother.  ROS:   Please see the history of present illness.     All other systems reviewed and are negative.  EKGs/Labs/Other Studies Reviewed:    The following studies were reviewed today:   EKG:  EKG is  ordered today.  The ekg ordered today demonstrates sinus rhythm.  Recent Labs: 04/07/2022: TSH 3.450 10/07/2022: ALT 33; BUN 14; Creatinine, Ser 1.05; Hemoglobin 14.6; Platelets 246; Potassium 4.4; Sodium 142  Recent Lipid Panel    Component Value Date/Time   CHOL 127 10/07/2022 1001   TRIG 123 10/07/2022 1001   HDL 44 10/07/2022 1001   CHOLHDL 2.9 10/07/2022 1001   LDLCALC 61 10/07/2022 1001     Risk Assessment/Calculations:      Physical Exam:    VS:  BP (!) 140/80 (BP Location: Left Arm, Patient Position: Sitting, Cuff Size: Normal)   Pulse 66   Ht '6\' 2"'$  (1.88 m)   Wt 274 lb 2 oz (124.3 kg)   SpO2 97%   BMI 35.20 kg/m     Wt Readings from Last 3 Encounters:  11/10/22 274 lb 2 oz (124.3 kg)  10/07/22 273 lb 12.8 oz (124.2 kg)  08/27/22 270 lb 6.4 oz (122.7 kg)     GEN:  Well nourished, well developed in no acute distress HEENT: Normal NECK: No JVD; No carotid bruits CARDIAC: RRR, no murmurs, rubs, gallops RESPIRATORY:  Clear to auscultation without rales, wheezing or rhonchi  ABDOMEN: Soft, non-tender, non-distended MUSCULOSKELETAL:  No edema; No deformity  SKIN: Warm and dry NEUROLOGIC:  Alert and oriented x 3 PSYCHIATRIC:  Normal affect   ASSESSMENT:    1. PVC (premature ventricular contraction)   2. Primary hypertension   3. Ascending aorta dilatation (HCC)   4. Pure hypercholesterolemia     PLAN:    In order of problems listed above:  Occasional PVCs, skipped heartbeats, 2.6% burden.  EF 55%.  Symptoms overall improved.  Continue to monitor.  If become worse, will consider beta-blocker. hypertension, BP elevated today, usually controlled.  Continue lisinopril 5 mg daily Mild Ascending aorta dilatation, repeat echo 10/2021  shows mild aortic root and ascending aorta dilatation.  Aortic root 42 mm, ascending aorta 4 mm.  Plan serial monitoring with echoes, repeat in about 2 years/2025. Hyperlipidemia, cholesterol controlled, continue Lipitor 40 mg daily.  Follow-up in 1 year.   Shared Decision Making/Informed Consent       Medication Adjustments/Labs and Tests Ordered: Current medicines are reviewed at length with the patient today.  Concerns regarding medicines are outlined above.  No orders of the defined types were placed in this encounter.  No orders of the defined types were placed in this encounter.   Patient Instructions  Medication Instructions:   Your physician recommends that you continue on  your current medications as directed. Please refer to the Current Medication list given to you today.  *If you need a refill on your cardiac medications before your next appointment, please call your pharmacy*   Lab Work:  None Ordered  If you have labs (blood work) drawn today and your tests are completely normal, you will receive your results only by: [place] (if you have MyChart) OR A paper copy in the mail If you have any lab test that is abnormal or we need to change your treatment, we will call you to review the results.   Testing/Procedures:  None Ordered   Follow-Up: At [facility], you and your health needs are our priority.  As part of our continuing mission to provide you with exceptional heart care, we have created designated Provider Care Teams.  These Care Teams include your primary Cardiologist (physician) and Advanced Practice Providers (APPs -  Physician Assistants and Nurse Practitioners) who all work together to provide you with the care you need, when you need it.  We recommend signing up for the patient portal called "MyChart".  Sign up information is provided on this After Visit Summary.  MyChart is used to connect with patients for Virtual Visits  (Telemedicine).  Patients are able to view lab/test results, encounter notes, upcoming appointments, etc.  Non-urgent messages can be sent to your provider as well.   To learn more about what you can do with MyChart, go to [email or web address].    Your next appointment:   12 month(s)  Provider:   You may see Kate Sable, MD or one of the following Advanced Practice Providers on your designated Care Team:   Murray Hodgkins, NP Christell Faith, PA-C Cadence Kathlen Mody, PA-C Gerrie Nordmann, NP   Signed, Kate Sable, MD  11/10/2022 3:21 PM    [place]

## 2022-11-10 NOTE — Patient Instructions (Signed)
Medication Instructions:   Your physician recommends that you continue on your current medications as directed. Please refer to the Current Medication list given to you today.  *If you need a refill on your cardiac medications before your next appointment, please call your pharmacy*   Lab Work:  None Ordered  If you have labs (blood work) drawn today and your tests are completely normal, you will receive your results only by: MyChart Message (if you have MyChart) OR A paper copy in the mail If you have any lab test that is abnormal or we need to change your treatment, we will call you to review the results.   Testing/Procedures:  None Ordered   Follow-Up: At [facility], you and your health needs are our priority.  As part of our continuing mission to provide you with exceptional heart care, we have created designated Provider Care Teams.  These Care Teams include your primary Cardiologist (physician) and Advanced Practice Providers (APPs -  Physician Assistants and Nurse Practitioners) who all work together to provide you with the care you need, when you need it.  We recommend signing up for the patient portal called "MyChart".  Sign up information is provided on this After Visit Summary.  MyChart is used to connect with patients for Virtual Visits (Telemedicine).  Patients are able to view lab/test results, encounter notes, upcoming appointments, etc.  Non-urgent messages can be sent to your provider as well.   To learn more about what you can do with MyChart, go to [email or web address].    Your next appointment:   12 month(s)  Provider:   You may see Brian Agbor-Etang, MD or one of the following Advanced Practice Providers on your designated Care Team:   Christopher Berge, NP Ryan Dunn, PA-C Cadence Furth, PA-C Sheri Hammock, NP  

## 2022-11-21 ENCOUNTER — Telehealth: Payer: BC Managed Care – PPO | Admitting: Nurse Practitioner

## 2022-11-21 DIAGNOSIS — U071 COVID-19: Secondary | ICD-10-CM

## 2022-11-21 MED ORDER — NIRMATRELVIR/RITONAVIR (PAXLOVID)TABLET: 3.0000 | ORAL_TABLET | Freq: Two times a day (BID) | ORAL | 0 refills | Status: AC

## 2022-11-21 NOTE — Progress Notes (Signed)
Virtual Visit Consent   Micheal Khan, you are scheduled for a virtual visit with a [facility] provider today. Just as with appointments in the office, your consent must be obtained to participate. Your consent will be active for this visit and any virtual visit you may have with one of our providers in the next 365 days. If you have a MyChart account, a copy of this consent can be sent to you electronically.  As this is a virtual visit, video technology does not allow for your provider to perform a traditional examination. This may limit your provider's ability to fully assess your condition. If your provider identifies any concerns that need to be evaluated in person or the need to arrange testing (such as labs, EKG, etc.), we will make arrangements to do so. Although advances in technology are sophisticated, we cannot ensure that it will always work on either your end or our end. If the connection with a video visit is poor, the visit may have to be switched to a telephone visit. With either a video or telephone visit, we are not always able to ensure that we have a secure connection.  By engaging in this virtual visit, you consent to the provision of healthcare and authorize for your insurance to be billed (if applicable) for the services provided during this visit. Depending on your insurance coverage, you may receive a charge related to this service.  I need to obtain your verbal consent now. Are you willing to proceed with your visit today? Micheal Khan has provided verbal consent on 11/21/2022 for a virtual visit (video or telephone). Micheal Pounds, NP  Date: 11/21/2022 5:02 PM  Virtual Visit via Video Note   I, Micheal Khan, connected with  Micheal Khan  ([id number], 60-04-1961) on 11/21/22 at  5:00 PM EST by a video-enabled telemedicine application and verified that I am speaking with the correct person using two identifiers.  Location: Patient: Virtual Visit Location Patient:  Home Provider: Virtual Visit Location Provider: Home Office   I discussed the limitations of evaluation and management by telemedicine and the availability of in person appointments. The patient expressed understanding and agreed to proceed.    History of Present Illness: Micheal Khan is a 62 y.o. who identifies as a male who was assigned male at birth, and is being seen today for COVID positive.   Micheal Khan tested positive today for COVID 19 with the following symptoms onset over the past 24 hours: PND, rhinorrhea, myalgias, fever, nasal congestion and head congestion  Problems:  Patient Active Problem List   Diagnosis Date Noted   Chest pain 09/08/2020   Personal history of colonic polyps 01/31/2020   Family history of colon cancer    Family history of melanoma    Family history of breast cancer    Hyperlipidemia 09/20/2019   Anxiety 07/23/2018   Obesity (BMI 35.0-39.9 without comorbidity) 03/15/2018   Abdominal pain 05/13/2017   Testicular cancer ([place]) 05/23/2015   GERD (gastroesophageal reflux disease) 05/23/2015   Essential hypertension, benign 05/23/2015    Allergies: No Known Allergies Medications:  Current Outpatient Medications:    atorvastatin (LIPITOR) 40 MG tablet, Take 1 tablet (40 mg total) by mouth daily., Disp: 90 tablet, Rfl: 1   cholecalciferol (VITAMIN D) 1000 units tablet, Take 8,000 Units by mouth daily. 2000 four times daily, Disp: , Rfl:    famotidine (PEPCID) 10 MG tablet, Take 10 mg by mouth every other day., Disp: ,  Rfl:    lisinopril (ZESTRIL) 5 MG tablet, Take 1 tablet (5 mg total) by mouth daily., Disp: 90 tablet, Rfl: 1   Multiple Vitamin (ONE-A-DAY MENS PO), Take 1 tablet by mouth daily., Disp: , Rfl:    nirmatrelvir/ritonavir (PAXLOVID) 20 x 150 MG & 10 x 100MG TABS, Take 3 tablets by mouth 2 (two) times daily for 5 days. (Take nirmatrelvir 150 mg two tablets twice daily for 5 days and ritonavir 100 mg one tablet twice daily for 5 days) Patient GFR  is 82, Disp: 30 tablet, Rfl: 0   omeprazole (PRILOSEC) 20 MG capsule, Take 1 capsule (20 mg total) by mouth daily., Disp: 90 capsule, Rfl: 3   Probiotic Product (PROBIOTIC DAILY PO), Take by mouth daily., Disp: , Rfl:   Observations/Objective: Patient is well-developed, well-nourished in no acute distress.  Resting comfortably  at home.  Head is normocephalic, atraumatic.  No labored breathing.  Speech is clear and coherent with logical content.  Patient is alert and oriented at baseline.    Assessment and Plan: 1. Positive self-administered antigen test for COVID-19 - nirmatrelvir/ritonavir (PAXLOVID) 20 x 150 MG & 10 x 100MG TABS; Take 3 tablets by mouth 2 (two) times daily for 5 days. (Take nirmatrelvir 150 mg two tablets twice daily for 5 days and ritonavir 100 mg one tablet twice daily for 5 days) Patient GFR is 82  Dispense: 30 tablet; Refill: 0   Please keep well-hydrated and get plenty of rest. Start a saline nasal rinse to flush out your nasal passages. You can use plain Mucinex to help thin congestion. If you have a humidifier, you can use this daily as needed.    You are to wear a mask for 5 days from onset of your symptoms.  After day 5, if you have had no fever and you are feeling better with NO symptoms, you can end masking. Keep in mind you can be contagious 10 days from the onset of symptoms  After day 5 if you have a fever or are having significant symptoms, please wear your mask for full 10 days.   If you note any worsening of symptoms, any significant shortness of breath or any chest pain, please seek ER evaluation ASAP.  Please do not delay care!    If you note any worsening of symptoms, any significant shortness of breath or any chest pain, please seek ER evaluation ASAP.  Please do not delay care!   Follow Up Instructions: I discussed the assessment and treatment plan with the patient. The patient was provided an opportunity to ask questions and all were  answered. The patient agreed with the plan and demonstrated an understanding of the instructions.  A copy of instructions were sent to the patient via MyChart unless otherwise noted below.    The patient was advised to call back or seek an in-person evaluation if the symptoms worsen or if the condition fails to improve as anticipated.  Time:  I spent 12 minutes with the patient via telehealth technology discussing the above problems/concerns.    Micheal Pounds, NP

## 2022-11-21 NOTE — Patient Instructions (Signed)
[name], thank you for joining Gildardo Pounds, NP for today's virtual visit.  While this provider is not your primary care provider (PCP), if your PCP is located in our provider database this encounter information will be shared with them immediately following your visit.   [facility] account gives you access to today's visit and all your visits, tests, and labs performed at [facility] " click here if you don't have a [facility] account or go to [email or web address]  Consent: (Patient) [name] provided verbal consent for this virtual visit at the beginning of the encounter.  Current Medications:  Current Outpatient Medications:    nirmatrelvir/ritonavir (PAXLOVID) 20 x 150 MG & 10 x 100MG TABS, Take 3 tablets by mouth 2 (two) times daily for 5 days. (Take nirmatrelvir 150 mg two tablets twice daily for 5 days and ritonavir 100 mg one tablet twice daily for 5 days) Patient GFR is 82, Disp: 30 tablet, Rfl: 0   atorvastatin (LIPITOR) 40 MG tablet, Take 1 tablet (40 mg total) by mouth daily., Disp: 90 tablet, Rfl: 1   cholecalciferol (VITAMIN D) 1000 units tablet, Take 8,000 Units by mouth daily. 2000 four times daily, Disp: , Rfl:    famotidine (PEPCID) 10 MG tablet, Take 10 mg by mouth every other day., Disp: , Rfl:    lisinopril (ZESTRIL) 5 MG tablet, Take 1 tablet (5 mg total) by mouth daily., Disp: 90 tablet, Rfl: 1   Multiple Vitamin (ONE-A-DAY MENS PO), Take 1 tablet by mouth daily., Disp: , Rfl:    omeprazole (PRILOSEC) 20 MG capsule, Take 1 capsule (20 mg total) by mouth daily., Disp: 90 capsule, Rfl: 3   Probiotic Product (PROBIOTIC DAILY PO), Take by mouth daily., Disp: , Rfl:    Medications ordered in this encounter:  Meds ordered this encounter  Medications   nirmatrelvir/ritonavir (PAXLOVID) 20 x 150 MG & 10 x 100MG TABS    Sig: Take 3 tablets by mouth 2 (two) times daily for 5 days. (Take nirmatrelvir 150 mg two tablets twice  daily for 5 days and ritonavir 100 mg one tablet twice daily for 5 days) Patient GFR is 82    Dispense:  30 tablet    Refill:  0    Order Specific Question:   Supervising Provider    Answer:   Chase Picket [id number]     *If you need refills on other medications prior to your next appointment, please contact your pharmacy*  Follow-Up: Call back or seek an in-person evaluation if the symptoms worsen or if the condition fails to improve as anticipated.  [facility] [phone]  Other Instructions  Please keep well-hydrated and get plenty of rest. Start a saline nasal rinse to flush out your nasal passages. You can use plain Mucinex to help thin congestion. If you have a humidifier, you can use this daily as needed.    You are to wear a mask for 5 days from onset of your symptoms.  After day 5, if you have had no fever and you are feeling better with NO symptoms, you can end masking. Keep in mind you can be contagious 10 days from the onset of symptoms  After day 5 if you have a fever or are having significant symptoms, please wear your mask for full 10 days.   If you note any worsening of symptoms, any significant shortness of breath or any chest pain, please seek ER evaluation ASAP.  Please do not delay care!    If you note any worsening of symptoms, any significant shortness of breath or any chest pain, please seek ER evaluation ASAP.  Please do not delay care!    If you have been instructed to have an in-person evaluation today at a local Urgent Care facility, please use the link below. It will take you to a list of all of our available [facility], including address, phone number and hours of operation. Please do not delay care.  [facility]  If you or a family member do not have a primary care provider, use the link below to schedule a visit and establish care. When you choose a [facility] primary care physician or advanced practice  provider, you gain a long-term partner in health. Find a Primary Care Provider  Learn more about 's in-office and virtual care options: [facility]

## 2023-01-06 DIAGNOSIS — L578 Other skin changes due to chronic exposure to nonionizing radiation: Secondary | ICD-10-CM | POA: Diagnosis not present

## 2023-01-06 DIAGNOSIS — L821 Other seborrheic keratosis: Secondary | ICD-10-CM | POA: Diagnosis not present

## 2023-01-06 DIAGNOSIS — L910 Hypertrophic scar: Secondary | ICD-10-CM | POA: Diagnosis not present

## 2023-01-06 DIAGNOSIS — L570 Actinic keratosis: Secondary | ICD-10-CM | POA: Diagnosis not present

## 2023-01-14 DIAGNOSIS — H2512 Age-related nuclear cataract, left eye: Secondary | ICD-10-CM | POA: Diagnosis not present

## 2023-01-14 DIAGNOSIS — H43813 Vitreous degeneration, bilateral: Secondary | ICD-10-CM | POA: Diagnosis not present

## 2023-04-08 ENCOUNTER — Ambulatory Visit (INDEPENDENT_AMBULATORY_CARE_PROVIDER_SITE_OTHER): Payer: BC Managed Care – PPO | Admitting: Nurse Practitioner

## 2023-04-08 ENCOUNTER — Encounter: Payer: Self-pay | Admitting: Nurse Practitioner

## 2023-04-08 VITALS — BP 116/68 | HR 58 | Temp 98.2°F | Wt 265.8 lb

## 2023-04-08 DIAGNOSIS — E669 Obesity, unspecified: Secondary | ICD-10-CM

## 2023-04-08 DIAGNOSIS — I77810 Thoracic aortic ectasia: Secondary | ICD-10-CM | POA: Diagnosis not present

## 2023-04-08 DIAGNOSIS — E782 Mixed hyperlipidemia: Secondary | ICD-10-CM

## 2023-04-08 DIAGNOSIS — I10 Essential (primary) hypertension: Secondary | ICD-10-CM

## 2023-04-08 DIAGNOSIS — I493 Ventricular premature depolarization: Secondary | ICD-10-CM | POA: Insufficient documentation

## 2023-04-08 MED ORDER — ATORVASTATIN CALCIUM 40 MG PO TABS: 40.0000 mg | ORAL_TABLET | Freq: Every day | ORAL | 1 refills | Status: DC

## 2023-04-08 MED ORDER — LISINOPRIL 5 MG PO TABS: 5.0000 mg | ORAL_TABLET | Freq: Every day | ORAL | 1 refills | Status: DC

## 2023-04-08 NOTE — Progress Notes (Signed)
BP 116/68   Pulse (!) 58   Temp 98.2 F (36.8 C) (Oral)   Wt 265 lb 12.8 oz (120.6 kg)   SpO2 96%   BMI 34.13 kg/m    Subjective:    Patient ID: Micheal Khan, male    DOB: 16-Jun-1961, 62 y.o.   MRN: [id number]  HPI: Micheal Khan is a 62 y.o. male  Chief Complaint  Patient presents with   Hyperlipidemia   Hypertension   HYPERTENSION / HYPERLIPIDEMIA Followed by Cardiology.  Dilated Aortic Root.   Satisfied with current treatment? yes Duration of hypertension: years BP monitoring frequency: weekly BP range: 109-117/80-85 BP medication side effects: no Past BP meds: lisinopril Duration of hyperlipidemia: years Cholesterol medication side effects: no Cholesterol supplements: none Past cholesterol medications: atorvastain (lipitor) Medication compliance: excellent compliance Aspirin: no Recent stressors: no Recurrent headaches: no Visual changes: no Palpitations: no Dyspnea: no Chest pain: no Lower extremity edema: no Dizzy/lightheaded: no  Testicular cancer in in 2005.  Was released from care after 5 years.    Relevant past medical, surgical, family and social history reviewed and updated as indicated. Interim medical history since our last visit reviewed. Allergies and medications reviewed and updated.  Review of Systems  Eyes:  Negative for visual disturbance.  Respiratory:  Negative for shortness of breath.   Cardiovascular:  Negative for chest pain and leg swelling.  Neurological:  Negative for light-headedness and headaches.    Per HPI unless specifically indicated above     Objective:    BP 116/68   Pulse (!) 58   Temp 98.2 F (36.8 C) (Oral)   Wt 265 lb 12.8 oz (120.6 kg)   SpO2 96%   BMI 34.13 kg/m   Wt Readings from Last 3 Encounters:  04/08/23 265 lb 12.8 oz (120.6 kg)  11/10/22 274 lb 2 oz (124.3 kg)  10/07/22 273 lb 12.8 oz (124.2 kg)    Physical Exam Vitals and nursing note reviewed.  Constitutional:      General: He is not  in acute distress.    Appearance: Normal appearance. He is not ill-appearing, toxic-appearing or diaphoretic.  HENT:     Head: Normocephalic.     Right Ear: External ear normal.     Left Ear: External ear normal.     Nose: Nose normal. No congestion or rhinorrhea.     Mouth/Throat:     Mouth: Mucous membranes are moist.  Eyes:     General:        Right eye: No discharge.        Left eye: No discharge.     Extraocular Movements: Extraocular movements intact.     Conjunctiva/sclera: Conjunctivae normal.     Pupils: Pupils are equal, round, and reactive to light.  Cardiovascular:     Rate and Rhythm: Normal rate and regular rhythm.     Heart sounds: No murmur heard. Pulmonary:     Effort: Pulmonary effort is normal. No respiratory distress.     Breath sounds: Normal breath sounds. No wheezing, rhonchi or rales.  Abdominal:     General: Abdomen is flat. Bowel sounds are normal.  Musculoskeletal:     Cervical back: Normal range of motion and neck supple.  Skin:    General: Skin is warm and dry.     Capillary Refill: Capillary refill takes less than 2 seconds.  Neurological:     General: No focal deficit present.     Mental Status: He is alert and  oriented to person, place, and time.  Psychiatric:        Mood and Affect: Mood normal.        Behavior: Behavior normal.        Thought Content: Thought content normal.        Judgment: Judgment normal.     Results for orders placed or performed in visit on 10/07/22  Vitamin D (25 hydroxy)  Result Value Ref Range   Vit D, 25-Hydroxy 48.1 30.0 - 100.0 ng/mL  Comp Met (CMET)  Result Value Ref Range   Glucose 96 70 - 99 mg/dL   BUN 14 6 - 24 mg/dL   Creatinine, Ser 1.61 0.76 - 1.27 mg/dL   eGFR 82 >09 UE/AVW/0.98   BUN/Creatinine Ratio 13 9 - 20   Sodium 142 134 - 144 mmol/L   Potassium 4.4 3.5 - 5.2 mmol/L   Chloride 103 96 - 106 mmol/L   CO2 25 20 - 29 mmol/L   Calcium 9.6 8.7 - 10.2 mg/dL   Total Protein 6.6 6.0 - 8.5 g/dL    Albumin 4.7 3.8 - 4.9 g/dL   Globulin, Total 1.9 1.5 - 4.5 g/dL   Albumin/Globulin Ratio 2.5 (H) 1.2 - 2.2   Bilirubin Total 0.4 0.0 - 1.2 mg/dL   Alkaline Phosphatase 95 44 - 121 IU/L   AST 22 0 - 40 IU/L   ALT 33 0 - 44 IU/L  CBC w/Diff  Result Value Ref Range   WBC 6.6 3.4 - 10.8 x10E3/uL   RBC 4.70 4.14 - 5.80 x10E6/uL   Hemoglobin 14.6 13.0 - 17.7 g/dL   Hematocrit 11.9 14.7 - 51.0 %   MCV 92 79 - 97 fL   MCH 31.1 26.6 - 33.0 pg   MCHC 33.8 31.5 - 35.7 g/dL   RDW 82.9 56.2 - 13.0 %   Platelets 246 150 - 450 x10E3/uL   Neutrophils 61 Not Estab. %   Lymphs 24 Not Estab. %   Monocytes 10 Not Estab. %   Eos 3 Not Estab. %   Basos 1 Not Estab. %   Neutrophils Absolute 4.2 1.4 - 7.0 x10E3/uL   Lymphocytes Absolute 1.5 0.7 - 3.1 x10E3/uL   Monocytes Absolute 0.6 0.1 - 0.9 x10E3/uL   EOS (ABSOLUTE) 0.2 0.0 - 0.4 x10E3/uL   Basophils Absolute 0.0 0.0 - 0.2 x10E3/uL   Immature Granulocytes 1 Not Estab. %   Immature Grans (Abs) 0.0 0.0 - 0.1 x10E3/uL  Lipid Profile  Result Value Ref Range   Cholesterol, Total 127 100 - 199 mg/dL   Triglycerides 865 0 - 149 mg/dL   HDL 44 >78 mg/dL   VLDL Cholesterol Cal 22 5 - 40 mg/dL   LDL Chol Calc (NIH) 61 0 - 99 mg/dL   Chol/HDL Ratio 2.9 0.0 - 5.0 ratio      Assessment & Plan:   Problem List Items Addressed This Visit       Cardiovascular and Mediastinum   Essential hypertension, benign    Chronic.  Controlled.  Continue with current medication regimen of Lisinopril 5mg .  Refills sent today.  Labs ordered today.  Return to clinic in 6 months for reevaluation.  Call sooner if concerns arise.        Relevant Medications   atorvastatin (LIPITOR) 40 MG tablet   lisinopril (ZESTRIL) 5 MG tablet   Other Relevant Orders   Comprehensive metabolic panel   Ascending aorta dilation (HCC) - Primary    Chronic.  Followed by Cardiology.  Due for reimaging in 2025.      Relevant Medications   atorvastatin (LIPITOR) 40 MG tablet    lisinopril (ZESTRIL) 5 MG tablet   PVC (premature ventricular contraction)    Chronic.  Controlled.  Continue with current medication regimen.  Continue to follow up with Cardiology.  Labs ordered today.  Return to clinic in 6 months for reevaluation.  Call sooner if concerns arise.        Relevant Medications   atorvastatin (LIPITOR) 40 MG tablet   lisinopril (ZESTRIL) 5 MG tablet     Other   Obesity (BMI 35.0-39.9 without comorbidity)    Recommended eating smaller high protein, low fat meals more frequently and exercising 30 mins a day 5 times a week with a goal of 10-15lb weight loss in the next 3 months.       Hyperlipidemia    Chronic.  Controlled.  Continue with current medication regimen of Atorvastatin daily.  Refills sent today.  Labs ordered today.  Return to clinic in 6 months for reevaluation.  Call sooner if concerns arise.        Relevant Medications   atorvastatin (LIPITOR) 40 MG tablet   lisinopril (ZESTRIL) 5 MG tablet   Other Relevant Orders   Lipid panel     Follow up plan: Return in about 6 months (around 10/08/2023) for Physical and Fasting labs.

## 2023-04-08 NOTE — Assessment & Plan Note (Signed)
Chronic.  Controlled.  Continue with current medication regimen of Lisinopril 5mg .  Refills sent today.  Labs ordered today.  Return to clinic in 6 months for reevaluation.  Call sooner if concerns arise.

## 2023-04-08 NOTE — Assessment & Plan Note (Signed)
Chronic.  Controlled.  Continue with current medication regimen. Continue to follow up with Cardiology.  Labs ordered today.  Return to clinic in 6 months for reevaluation.  Call sooner if concerns arise.   

## 2023-04-08 NOTE — Assessment & Plan Note (Signed)
Recommended eating smaller high protein, low fat meals more frequently and exercising 30 mins a day 5 times a week with a goal of 10-15lb weight loss in the next 3 months.  

## 2023-04-08 NOTE — Assessment & Plan Note (Signed)
Chronic.  Controlled.  Continue with current medication regimen of Atorvastatin daily.  Refills sent today.  Labs ordered today.  Return to clinic in 6 months for reevaluation.  Call sooner if concerns arise.  ° °

## 2023-04-08 NOTE — Assessment & Plan Note (Signed)
Chronic.  Followed by Cardiology.  Due for reimaging in 2025.

## 2023-04-09 LAB — COMPREHENSIVE METABOLIC PANEL
ALT: 22 IU/L (ref 0–44)
AST: 20 IU/L (ref 0–40)
Albumin: 4.5 g/dL (ref 3.8–4.9)
Alkaline Phosphatase: 61 IU/L (ref 44–121)
BUN/Creatinine Ratio: 16 (ref 10–24)
BUN: 19 mg/dL (ref 8–27)
Bilirubin Total: 0.7 mg/dL (ref 0.0–1.2)
CO2: 22 mmol/L (ref 20–29)
Calcium: 9.1 mg/dL (ref 8.6–10.2)
Chloride: 107 mmol/L — ABNORMAL HIGH (ref 96–106)
Creatinine, Ser: 1.16 mg/dL (ref 0.76–1.27)
Globulin, Total: 2 g/dL (ref 1.5–4.5)
Glucose: 93 mg/dL (ref 70–99)
Potassium: 4.2 mmol/L (ref 3.5–5.2)
Sodium: 142 mmol/L (ref 134–144)
Total Protein: 6.5 g/dL (ref 6.0–8.5)
eGFR: 72 mL/min/{1.73_m2} (ref >59)

## 2023-04-09 LAB — LIPID PANEL
Chol/HDL Ratio: 2.5 ratio (ref 0.0–5.0)
Cholesterol, Total: 108 mg/dL (ref 100–199)
HDL: 44 mg/dL (ref >39)
LDL Chol Calc (NIH): 46 mg/dL (ref 0–99)
Triglycerides: 92 mg/dL (ref 0–149)
VLDL Cholesterol Cal: 18 mg/dL (ref 5–40)

## 2023-04-11 NOTE — Progress Notes (Signed)
HI Brett Canales. It was nice to see you last week.  Your lab work looks good.  No concerns at this time. Continue with your current medication regimen.  Follow up as discussed.  Please let me know if you have any questions.

## 2023-10-10 ENCOUNTER — Encounter: Payer: Self-pay | Admitting: Nurse Practitioner

## 2023-10-10 ENCOUNTER — Ambulatory Visit (INDEPENDENT_AMBULATORY_CARE_PROVIDER_SITE_OTHER): Payer: BC Managed Care – PPO | Admitting: Nurse Practitioner

## 2023-10-10 VITALS — BP 118/71 | HR 63 | Temp 97.7°F | Ht 74.5 in | Wt 269.0 lb

## 2023-10-10 DIAGNOSIS — E782 Mixed hyperlipidemia: Secondary | ICD-10-CM | POA: Diagnosis not present

## 2023-10-10 DIAGNOSIS — I77810 Thoracic aortic ectasia: Secondary | ICD-10-CM

## 2023-10-10 DIAGNOSIS — I10 Essential (primary) hypertension: Secondary | ICD-10-CM

## 2023-10-10 DIAGNOSIS — I493 Ventricular premature depolarization: Secondary | ICD-10-CM

## 2023-10-10 DIAGNOSIS — Z0000 Encounter for general adult medical examination without abnormal findings: Secondary | ICD-10-CM

## 2023-10-10 DIAGNOSIS — E669 Obesity, unspecified: Secondary | ICD-10-CM

## 2023-10-10 LAB — URINALYSIS, ROUTINE W REFLEX MICROSCOPIC
Bilirubin, UA: NEGATIVE
Glucose, UA: NEGATIVE
Ketones, UA: NEGATIVE
Leukocytes,UA: NEGATIVE
Nitrite, UA: NEGATIVE
Protein,UA: NEGATIVE
RBC, UA: NEGATIVE
Specific Gravity, UA: 1.02 (ref 1.005–1.030)
Urobilinogen, Ur: 0.2 mg/dL (ref 0.2–1.0)
pH, UA: 7 (ref 5.0–7.5)

## 2023-10-10 MED ORDER — ATORVASTATIN CALCIUM 40 MG PO TABS: 40.0000 mg | ORAL_TABLET | Freq: Every day | ORAL | 1 refills | Status: DC

## 2023-10-10 MED ORDER — LISINOPRIL 5 MG PO TABS: 5.0000 mg | ORAL_TABLET | Freq: Every day | ORAL | 1 refills | Status: DC

## 2023-10-10 NOTE — Progress Notes (Signed)
BP 118/71 (BP Location: Left Arm, Patient Position: Sitting)   Pulse 63   Temp 97.7 F (36.5 C) (Oral)   Ht 6' 2.5" (1.892 m)   Wt 269 lb (122 kg)   SpO2 95%   BMI 34.08 kg/m    Subjective:    Patient ID: Micheal Khan, male    DOB: October 12, 1960, 62 y.o.   MRN: [id number]  HPI: Micheal Khan is a 62 y.o. male presenting on 10/10/2023 for comprehensive medical examination. Current medical complaints include:none  He currently lives with: Interim Problems from his last visit: no  HYPERTENSION / HYPERLIPIDEMIA Followed by Cardiology.  Dilated Aortic Root.   Satisfied with current treatment? yes Duration of hypertension: years BP monitoring frequency: once in a while BP range: 109-117/80-85 BP medication side effects: no Past BP meds: lisinopril Duration of hyperlipidemia: years Cholesterol medication side effects: no Cholesterol supplements: none Past cholesterol medications: atorvastain (lipitor) Medication compliance: excellent compliance Aspirin: no Recent stressors: no Recurrent headaches: no Visual changes: no Palpitations: no Dyspnea: no Chest pain: no Lower extremity edema: no Dizzy/lightheaded: no  Testicular cancer in in 2005.  Was released from care after 5 years.   Depression Screen done today and results listed below:     10/10/2023    9:00 AM 04/08/2023    8:17 AM 10/07/2022    9:08 AM 04/07/2022    8:17 AM 10/06/2021    9:02 AM  Depression screen PHQ 2/9  Decreased Interest 0 0 0 0 0  Down, Depressed, Hopeless 0 0 0 0 0  PHQ - 2 Score 0 0 0 0 0  Altered sleeping 0 0 0 0 0  Tired, decreased energy 0 0 0 0 0  Change in appetite 0 0 0 0 0  Feeling bad or failure about yourself  0 0 0 0 0  Trouble concentrating 0 0 0 0 0  Moving slowly or fidgety/restless 0 0 0 0 0  Suicidal thoughts 0 0 0 0 0  PHQ-9 Score 0 0 0 0 0  Difficult doing work/chores  Not difficult at all Not difficult at all Not difficult at all Not difficult at all    The  patient does not have a history of falls. I did complete a risk assessment for falls. A plan of care for falls was documented.   Past Medical History:  Past Medical History:  Diagnosis Date   Cancer (HCC) 07/2004   testicular-surgery and chemo   Complication of anesthesia    nausea with colonoscopy in [place] 9 years ago   Family history of breast cancer    Family history of colon cancer    Family history of melanoma    GERD (gastroesophageal reflux disease)    Hypertension    PONV (postoperative nausea and vomiting)     Surgical History:  Past Surgical History:  Procedure Laterality Date   COLONOSCOPY WITH PROPOFOL     COLONOSCOPY WITH PROPOFOL N/A 01/08/2020   Procedure: COLONOSCOPY WITH PROPOFOL;  Surgeon: Wyline Mood, MD;  Location: [facility] ENDOSCOPY;  Service: Gastroenterology;  Laterality: N/A;   COLONOSCOPY WITH PROPOFOL N/A 01/16/2021   Procedure: COLONOSCOPY WITH PROPOFOL;  Surgeon: Wyline Mood, MD;  Location: [facility] ENDOSCOPY;  Service: Gastroenterology;  Laterality: N/A;   ESOPHAGOGASTRODUODENOSCOPY (EGD) WITH PROPOFOL     TESTICLE REMOVAL Left 07/2004    Medications:  Current Outpatient Medications on File Prior to Visit  Medication Sig   cholecalciferol (VITAMIN D) 1000 units tablet Take 4,000 Units by  mouth daily.   famotidine (PEPCID) 10 MG tablet Take 10 mg by mouth daily.   Magnesium 250 MG CAPS Take 250 mg by mouth daily. Take one tablet by mouth daily   Multiple Vitamin (ONE-A-DAY MENS PO) Take 1 tablet by mouth daily.   Probiotic Product (PROBIOTIC DAILY PO) Take by mouth daily.   No current facility-administered medications on file prior to visit.    Allergies:  No Known Allergies  Social History:  Social History   Socioeconomic History   Marital status: Married    Spouse name: Not on file   Number of children: Not on file   Years of education: Not on file   Highest education level: Bachelor's degree (e.g., BA, AB, BS)  Occupational History    Not on file  Tobacco Use   Smoking status: Never   Smokeless tobacco: Never  Vaping Use   Vaping status: Never Used  Substance and Sexual Activity   Alcohol use: No    Alcohol/week: 0.0 standard drinks of alcohol   Drug use: No   Sexual activity: Yes  Other Topics Concern   Not on file  Social History Narrative   Not on file   Social Drivers of Health   Financial Resource Strain: Low Risk  (04/04/2023)   Overall Financial Resource Strain (CARDIA)    Difficulty of Paying Living Expenses: Not hard at all  Food Insecurity: No Food Insecurity (04/04/2023)   Hunger Vital Sign    Worried About Running Out of Food in the Last Year: Never true    Ran Out of Food in the Last Year: Never true  Transportation Needs: No Transportation Needs (04/04/2023)   PRAPARE - Administrator, Civil Service (Medical): No    Lack of Transportation (Non-Medical): No  Physical Activity: Sufficiently Active (04/04/2023)   Exercise Vital Sign    Days of Exercise per Week: 5 days    Minutes of Exercise per Session: 70 min  Stress: No Stress Concern Present (04/04/2023)   [employer] of Occupational Health - Occupational Stress Questionnaire    Feeling of Stress : Not at all  Social Connections: Socially Integrated (04/04/2023)   Social Connection and Isolation Panel [NHANES]    Frequency of Communication with Friends and Family: Twice a week    Frequency of Social Gatherings with Friends and Family: Once a week    Attends Religious Services: More than 4 times per year    Active Member of [employer] or Organizations: Yes    Attends Engineer, structural: More than 4 times per year    Marital Status: Married  Catering manager Violence: Not on file   Social History   Tobacco Use  Smoking Status Never  Smokeless Tobacco Never   Social History   Substance and Sexual Activity  Alcohol Use No   Alcohol/week: 0.0 standard drinks of alcohol    Family History:  Family History   Problem Relation Age of Onset   Melanoma Mother 21       metastatic to brain   Heart disease Father        MI   Colon cancer Brother 30   Cancer Maternal Grandfather        unknown type, maybe brain tumor   Stroke Paternal Grandmother    Heart disease Paternal Grandfather    Breast cancer Maternal Aunt 73   Colon polyps Paternal Uncle        less than 10 cumulative    Past medical  history, surgical history, medications, allergies, family history and social history reviewed with patient today and changes made to appropriate areas of the chart.   Review of Systems  Eyes:  Negative for blurred vision and double vision.  Respiratory:  Negative for shortness of breath.   Cardiovascular:  Negative for chest pain, palpitations and leg swelling.  Neurological:  Negative for dizziness and headaches.   All other ROS negative except what is listed above and in the HPI.      Objective:    BP 118/71 (BP Location: Left Arm, Patient Position: Sitting)   Pulse 63   Temp 97.7 F (36.5 C) (Oral)   Ht 6' 2.5" (1.892 m)   Wt 269 lb (122 kg)   SpO2 95%   BMI 34.08 kg/m   Wt Readings from Last 3 Encounters:  10/10/23 269 lb (122 kg)  04/08/23 265 lb 12.8 oz (120.6 kg)  11/10/22 274 lb 2 oz (124.3 kg)    Physical Exam Vitals and nursing note reviewed.  Constitutional:      General: He is not in acute distress.    Appearance: Normal appearance. He is obese. He is not ill-appearing, toxic-appearing or diaphoretic.  HENT:     Head: Normocephalic.     Right Ear: Tympanic membrane, ear canal and external ear normal.     Left Ear: Tympanic membrane, ear canal and external ear normal.     Nose: Nose normal. No congestion or rhinorrhea.     Mouth/Throat:     Mouth: Mucous membranes are moist.  Eyes:     General:        Right eye: No discharge.        Left eye: No discharge.     Extraocular Movements: Extraocular movements intact.     Conjunctiva/sclera: Conjunctivae normal.     Pupils:  Pupils are equal, round, and reactive to light.  Cardiovascular:     Rate and Rhythm: Normal rate and regular rhythm.     Heart sounds: No murmur heard. Pulmonary:     Effort: Pulmonary effort is normal. No respiratory distress.     Breath sounds: Normal breath sounds. No wheezing, rhonchi or rales.  Abdominal:     General: Abdomen is flat. Bowel sounds are normal. There is no distension.     Palpations: Abdomen is soft.     Tenderness: There is no abdominal tenderness. There is no guarding.  Musculoskeletal:     Cervical back: Normal range of motion and neck supple.  Skin:    General: Skin is warm and dry.     Capillary Refill: Capillary refill takes less than 2 seconds.  Neurological:     General: No focal deficit present.     Mental Status: He is alert and oriented to person, place, and time.     Cranial Nerves: No cranial nerve deficit.     Motor: No weakness.     Deep Tendon Reflexes: Reflexes normal.  Psychiatric:        Mood and Affect: Mood normal.        Behavior: Behavior normal.        Thought Content: Thought content normal.        Judgment: Judgment normal.     Results for orders placed or performed in visit on 04/08/23  Lipid panel   Collection Time: 04/08/23  8:29 AM  Result Value Ref Range   Cholesterol, Total 108 100 - 199 mg/dL   Triglycerides 92 0 - 149 mg/dL  HDL 44 >39 mg/dL   VLDL Cholesterol Cal 18 5 - 40 mg/dL   LDL Chol Calc (NIH) 46 0 - 99 mg/dL   Chol/HDL Ratio 2.5 0.0 - 5.0 ratio  Comprehensive metabolic panel   Collection Time: 04/08/23  8:29 AM  Result Value Ref Range   Glucose 93 70 - 99 mg/dL   BUN 19 8 - 27 mg/dL   Creatinine, Ser 1.61 0.76 - 1.27 mg/dL   eGFR 72 >09 UE/AVW/0.98   BUN/Creatinine Ratio 16 10 - 24   Sodium 142 134 - 144 mmol/L   Potassium 4.2 3.5 - 5.2 mmol/L   Chloride 107 (H) 96 - 106 mmol/L   CO2 22 20 - 29 mmol/L   Calcium 9.1 8.6 - 10.2 mg/dL   Total Protein 6.5 6.0 - 8.5 g/dL   Albumin 4.5 3.8 - 4.9 g/dL    Globulin, Total 2.0 1.5 - 4.5 g/dL   Bilirubin Total 0.7 0.0 - 1.2 mg/dL   Alkaline Phosphatase 61 44 - 121 IU/L   AST 20 0 - 40 IU/L   ALT 22 0 - 44 IU/L      Assessment & Plan:   Problem List Items Addressed This Visit       Cardiovascular and Mediastinum   Essential hypertension, benign   Chronic.  Controlled.  Continue with current medication regimen of Lisinopril 5mg .  Refills sent today.  Labs ordered today.  Return to clinic in 6 months for reevaluation.  Call sooner if concerns arise.       Relevant Medications   atorvastatin (LIPITOR) 40 MG tablet   lisinopril (ZESTRIL) 5 MG tablet   Ascending aorta dilation (HCC)   Chronic.  Followed by Cardiology.  Due for reimaging in 2025.  Encouraged patient to make an appt for January with Cardiology.       Relevant Medications   atorvastatin (LIPITOR) 40 MG tablet   lisinopril (ZESTRIL) 5 MG tablet   PVC (premature ventricular contraction)   Started taking a magnesium supplement. Has noticed a decrease in PCVs.  Mag level checked at visit today.        Relevant Medications   atorvastatin (LIPITOR) 40 MG tablet   lisinopril (ZESTRIL) 5 MG tablet   Other Relevant Orders   Magnesium     Other   Obesity (BMI 35.0-39.9 without comorbidity)   Recommended eating smaller high protein, low fat meals more frequently and exercising 30 mins a day 5 times a week with a goal of 10-15lb weight loss in the next 3 months.       Hyperlipidemia   Chronic.  Controlled.  Continue with current medication regimen of Atorvastatin daily.  Refills sent today.  Labs ordered today.  Return to clinic in 6 months for reevaluation.  Call sooner if concerns arise.       Relevant Medications   atorvastatin (LIPITOR) 40 MG tablet   lisinopril (ZESTRIL) 5 MG tablet   Other Relevant Orders   Lipid panel   Other Visit Diagnoses       Annual physical exam    -  Primary   Health maintenance reviewed during visit today.  Labs ordered.  Vaccines  reviewed.  Colonoscopy due to April.   Relevant Orders   TSH   PSA   Lipid panel   CBC with Differential/Platelet   Comprehensive metabolic panel   Urinalysis, Routine w reflex microscopic        Discussed aspirin prophylaxis for myocardial infarction prevention and decision was it  was not indicated  LABORATORY TESTING:  Health maintenance labs ordered today as discussed above.   The natural history of prostate cancer and ongoing controversy regarding screening and potential treatment outcomes of prostate cancer has been discussed with the patient. The meaning of a false positive PSA and a false negative PSA has been discussed. He indicates understanding of the limitations of this screening test and wishes to proceed with screening PSA testing.   IMMUNIZATIONS:   - Tdap: Tetanus vaccination status reviewed: last tetanus booster within 10 years. - Influenza: Up to date - Pneumovax: Not applicable - Prevnar: Not applicable - COVID: Refused - HPV: Not applicable - Shingrix vaccine: Up to date  SCREENING: - Colonoscopy: Up to date  Discussed with patient purpose of the colonoscopy is to detect colon cancer at curable precancerous or early stages   - AAA Screening: Not applicable  -Hearing Test: Not applicable  -Spirometry: Not applicable   PATIENT COUNSELING:    Sexuality: Discussed sexually transmitted diseases, partner selection, use of condoms, avoidance of unintended pregnancy  and contraceptive alternatives.   Advised to avoid cigarette smoking.  I discussed with the patient that most people either abstain from alcohol or drink within safe limits (<=14/week and <=4 drinks/occasion for males, <=7/weeks and <= 3 drinks/occasion for females) and that the risk for alcohol disorders and other health effects rises proportionally with the number of drinks per week and how often a drinker exceeds daily limits.  Discussed cessation/primary prevention of drug use and availability  of treatment for abuse.   Diet: Encouraged to adjust caloric intake to maintain  or achieve ideal body weight, to reduce intake of dietary saturated fat and total fat, to limit sodium intake by avoiding high sodium foods and not adding table salt, and to maintain adequate dietary potassium and calcium preferably from fresh fruits, vegetables, and low-fat dairy products.    stressed the importance of regular exercise  Injury prevention: Discussed safety belts, safety helmets, smoke detector, smoking near bedding or upholstery.   Dental health: Discussed importance of regular tooth brushing, flossing, and dental visits.   Follow up plan: NEXT PREVENTATIVE PHYSICAL DUE IN 1 YEAR. Return in about 6 months (around 04/09/2024) for HTN, HLD, DM2 FU.

## 2023-10-10 NOTE — Assessment & Plan Note (Signed)
Recommended eating smaller high protein, low fat meals more frequently and exercising 30 mins a day 5 times a week with a goal of 10-15lb weight loss in the next 3 months.  

## 2023-10-10 NOTE — Assessment & Plan Note (Signed)
Chronic.  Controlled.  Continue with current medication regimen of Atorvastatin daily.  Refills sent today.  Labs ordered today.  Return to clinic in 6 months for reevaluation.  Call sooner if concerns arise.    

## 2023-10-10 NOTE — Assessment & Plan Note (Signed)
Started taking a magnesium supplement. Has noticed a decrease in PCVs.  Mag level checked at visit today.

## 2023-10-10 NOTE — Assessment & Plan Note (Signed)
Chronic.  Followed by Cardiology.  Due for reimaging in 2025.  Encouraged patient to make an appt for January with Cardiology.

## 2023-10-10 NOTE — Assessment & Plan Note (Signed)
Chronic.  Controlled.  Continue with current medication regimen of Lisinopril 5mg .  Refills sent today.  Labs ordered today.  Return to clinic in 6 months for reevaluation.  Call sooner if concerns arise.

## 2023-10-11 LAB — TSH: TSH: 3.78 u[IU]/mL (ref 0.450–4.500)

## 2023-10-11 LAB — CBC WITH DIFFERENTIAL/PLATELET
Basophils Absolute: 0 10*3/uL (ref 0.0–0.2)
Basos: 1 %
EOS (ABSOLUTE): 0.2 10*3/uL (ref 0.0–0.4)
Eos: 3 %
Hematocrit: 44.5 % (ref 37.5–51.0)
Hemoglobin: 14.3 g/dL (ref 13.0–17.7)
Immature Grans (Abs): 0 10*3/uL (ref 0.0–0.1)
Immature Granulocytes: 0 %
Lymphocytes Absolute: 1.6 10*3/uL (ref 0.7–3.1)
Lymphs: 28 %
MCH: 30.7 pg (ref 26.6–33.0)
MCHC: 32.1 g/dL (ref 31.5–35.7)
MCV: 96 fL (ref 79–97)
Monocytes Absolute: 0.6 10*3/uL (ref 0.1–0.9)
Monocytes: 10 %
Neutrophils Absolute: 3.2 10*3/uL (ref 1.4–7.0)
Neutrophils: 58 %
Platelets: 212 10*3/uL (ref 150–450)
RBC: 4.66 x10E6/uL (ref 4.14–5.80)
RDW: 12.3 % (ref 11.6–15.4)
WBC: 5.6 10*3/uL (ref 3.4–10.8)

## 2023-10-11 LAB — COMPREHENSIVE METABOLIC PANEL
ALT: 34 [IU]/L (ref 0–44)
AST: 24 [IU]/L (ref 0–40)
Albumin: 4.5 g/dL (ref 3.8–4.9)
Alkaline Phosphatase: 77 [IU]/L (ref 44–121)
BUN/Creatinine Ratio: 12 (ref 10–24)
BUN: 13 mg/dL (ref 8–27)
Bilirubin Total: 0.4 mg/dL (ref 0.0–1.2)
CO2: 22 mmol/L (ref 20–29)
Calcium: 9.1 mg/dL (ref 8.6–10.2)
Chloride: 105 mmol/L (ref 96–106)
Creatinine, Ser: 1.11 mg/dL (ref 0.76–1.27)
Globulin, Total: 1.7 g/dL (ref 1.5–4.5)
Glucose: 95 mg/dL (ref 70–99)
Potassium: 4.4 mmol/L (ref 3.5–5.2)
Sodium: 142 mmol/L (ref 134–144)
Total Protein: 6.2 g/dL (ref 6.0–8.5)
eGFR: 76 mL/min/{1.73_m2} (ref >59)

## 2023-10-11 LAB — LIPID PANEL
Chol/HDL Ratio: 2.8 {ratio} (ref 0.0–5.0)
Cholesterol, Total: 138 mg/dL (ref 100–199)
HDL: 49 mg/dL (ref >39)
LDL Chol Calc (NIH): 68 mg/dL (ref 0–99)
Triglycerides: 114 mg/dL (ref 0–149)
VLDL Cholesterol Cal: 21 mg/dL (ref 5–40)

## 2023-10-11 LAB — MAGNESIUM: Magnesium: 2.1 mg/dL (ref 1.6–2.3)

## 2023-10-11 LAB — PSA: Prostate Specific Ag, Serum: 0.7 ng/mL (ref 0.0–4.0)

## 2024-03-16 ENCOUNTER — Encounter: Payer: Self-pay | Admitting: Cardiology

## 2024-04-03 ENCOUNTER — Other Ambulatory Visit: Payer: Self-pay | Admitting: Nurse Practitioner

## 2024-04-03 DIAGNOSIS — I10 Essential (primary) hypertension: Secondary | ICD-10-CM

## 2024-04-03 NOTE — Telephone Encounter (Unsigned)
 Copied from CRM [id number]. Topic: Clinical - Medication Refill >> Apr 03, 2024  1:18 PM Ivette P wrote: Medication: lisinopril  (ZESTRIL ) 5 MG tablet  Has the patient contacted their pharmacy? Yes (Agent: If no, request that the patient contact the pharmacy for the refill. If patient does not wish to contact the pharmacy document the reason why and proceed with request.) (Agent: If yes, when and what did the pharmacy advise?)  This is the patient's preferred pharmacy:  [facility] - [place], [place] - [address] [place] [address] Phone: [phone] Fax: [phone]  Is this the correct pharmacy for this prescription? Yes If no, delete pharmacy and type the correct one.   Has the prescription been filled recently? No, 10/10/2023  Is the patient out of the medication? No, pt will be out before weekend and would like to have medication to hold over for weekend.   Has the patient been seen for an appointment in the last year OR does the patient have an upcoming appointment? Yes,04/09/2024  Can we respond through MyChart? Yes  Agent: Please be advised that Rx refills may take up to 3 business days. We ask that you follow-up with your pharmacy.

## 2024-04-05 MED ORDER — LISINOPRIL 5 MG PO TABS: 5.0000 mg | ORAL_TABLET | Freq: Every day | ORAL | 0 refills | Status: DC

## 2024-04-05 NOTE — Telephone Encounter (Signed)
 Requested Prescriptions  Pending Prescriptions Disp Refills   lisinopril  (ZESTRIL ) 5 MG tablet 90 tablet 0    Sig: Take 1 tablet (5 mg total) by mouth daily.     Cardiovascular:  ACE Inhibitors Failed - 04/05/2024 11:03 AM      Failed - Valid encounter within last 6 months    Recent Outpatient Visits   None     Future Appointments             In 4 days Melvin Pao, NP [place] [facility]   In 3 weeks Darliss Rogue, MD [facility] - Cr in normal range and within 180 days    Creatinine  Date Value Ref Range Status  03/12/2014 1.09 0.60 - 1.30 mg/dL Final   Creatinine, Ser  Date Value Ref Range Status  10/10/2023 1.11 0.76 - 1.27 mg/dL Final         Passed - K in normal range and within 180 days    Potassium  Date Value Ref Range Status  10/10/2023 4.4 3.5 - 5.2 mmol/L Final  03/12/2014 3.8 3.5 - 5.1 mmol/L Final         Passed - Patient is not pregnant      Passed - Last BP in normal range    BP Readings from Last 1 Encounters:  10/10/23 118/71

## 2024-04-06 ENCOUNTER — Telehealth: Payer: Self-pay | Admitting: Nurse Practitioner

## 2024-04-06 NOTE — Telephone Encounter (Signed)
 [facility] called and spoke to Micheal Khan, Pensions consultant about the refill(s) lisinopril  requested. Advised it was sent on 04/05/24 #90/0 refill(s). She said it is ready for pickup. I asked if the let the patient's know and she said they don't do that. Patient called, left VM that medication is ready for pickup at the pharmacy and to call them or the office back with any questions.    Copied from CRM [id number]. Topic: Clinical - Medication Refill >> Apr 03, 2024  1:18 PM Micheal Khan wrote: Medication: lisinopril  (ZESTRIL ) 5 MG tablet  Has the patient contacted their pharmacy? Yes (Agent: If no, request that the patient contact the pharmacy for the refill. If patient does not wish to contact the pharmacy document the reason why and proceed with request.) (Agent: If yes, when and what did the pharmacy advise?)  This is the patient's preferred pharmacy:  [facility] - [place], [place] - [address] [place] [address] Phone: [phone] Fax: [phone]  Is this the correct pharmacy for this prescription? Yes If no, delete pharmacy and type the correct one.   Has the prescription been filled recently? No, 10/10/2023  Is the patient out of the medication? No, pt will be out before weekend and would like to have medication to hold over for weekend.   Has the patient been seen for an appointment in the last year OR does the patient have an upcoming appointment? Yes,04/09/2024  Can we respond through MyChart? Yes  Agent: Please be advised that Rx refills may take up to 3 business days. We ask that you follow-up with your pharmacy.

## 2024-04-09 ENCOUNTER — Ambulatory Visit: Payer: Self-pay | Admitting: Nurse Practitioner

## 2024-04-09 ENCOUNTER — Encounter: Payer: Self-pay | Admitting: Nurse Practitioner

## 2024-04-09 VITALS — BP 117/69 | HR 62 | Ht 74.0 in | Wt 270.0 lb

## 2024-04-09 DIAGNOSIS — I10 Essential (primary) hypertension: Secondary | ICD-10-CM | POA: Diagnosis not present

## 2024-04-09 DIAGNOSIS — Z1211 Encounter for screening for malignant neoplasm of colon: Secondary | ICD-10-CM

## 2024-04-09 DIAGNOSIS — Z23 Encounter for immunization: Secondary | ICD-10-CM

## 2024-04-09 DIAGNOSIS — E782 Mixed hyperlipidemia: Secondary | ICD-10-CM

## 2024-04-09 DIAGNOSIS — I77810 Thoracic aortic ectasia: Secondary | ICD-10-CM | POA: Diagnosis not present

## 2024-04-09 MED ORDER — ATORVASTATIN CALCIUM 40 MG PO TABS: 40.0000 mg | ORAL_TABLET | Freq: Every day | ORAL | 1 refills | Status: DC

## 2024-04-09 MED ORDER — LISINOPRIL 5 MG PO TABS
5.0000 mg | ORAL_TABLET | Freq: Every day | ORAL | 1 refills | Status: AC
Start: 2024-04-09 — End: ?

## 2024-04-09 NOTE — Assessment & Plan Note (Signed)
Chronic.  Controlled.  Continue with current medication regimen of atorvastatin.  Refills sent today.  Labs ordered today.  Return to clinic in 6 months for reevaluation.  Call sooner if concerns arise.

## 2024-04-09 NOTE — Progress Notes (Signed)
 BP 117/69   Pulse 62   Ht 6' 2 (1.88 m)   Wt 270 lb (122.5 kg)   BMI 34.67 kg/m    Subjective:    Patient ID: Micheal Khan, male    DOB: 02/14/1961, 63 y.o.   MRN: [id number]  HPI: Micheal Khan is a 63 y.o. male  Chief Complaint  Patient presents with   Follow-up   HYPERTENSION / HYPERLIPIDEMIA Followed by Cardiology.  Dilated Aortic Root.  Next appt is in July.   Satisfied with current treatment? yes Duration of hypertension: years BP monitoring frequency: once in a while BP range: 109-117/80-85 BP medication side effects: no Past BP meds: lisinopril  Duration of hyperlipidemia: years Cholesterol medication side effects: no Cholesterol supplements: none Past cholesterol medications: atorvastain (lipitor) Medication compliance: excellent compliance Aspirin: no Recent stressors: no Recurrent headaches: no Visual changes: no Palpitations: no Dyspnea: no Chest pain: no Lower extremity edema: no Dizzy/lightheaded: no  Testicular cancer in in 2005.  Was released from care after 5 years.   Relevant past medical, surgical, family and social history reviewed and updated as indicated. Interim medical history since our last visit reviewed. Allergies and medications reviewed and updated.  Review of Systems  Eyes:  Negative for visual disturbance.  Respiratory:  Negative for shortness of breath.   Cardiovascular:  Negative for chest pain and leg swelling.  Neurological:  Negative for light-headedness and headaches.    Per HPI unless specifically indicated above     Objective:    BP 117/69   Pulse 62   Ht 6' 2 (1.88 m)   Wt 270 lb (122.5 kg)   BMI 34.67 kg/m   Wt Readings from Last 3 Encounters:  04/09/24 270 lb (122.5 kg)  10/10/23 269 lb (122 kg)  04/08/23 265 lb 12.8 oz (120.6 kg)    Physical Exam Vitals and nursing note reviewed.  Constitutional:      General: He is not in acute distress.    Appearance: Normal appearance. He is not  ill-appearing, toxic-appearing or diaphoretic.  HENT:     Head: Normocephalic.     Right Ear: External ear normal.     Left Ear: External ear normal.     Nose: Nose normal. No congestion or rhinorrhea.     Mouth/Throat:     Mouth: Mucous membranes are moist.   Eyes:     General:        Right eye: No discharge.        Left eye: No discharge.     Extraocular Movements: Extraocular movements intact.     Conjunctiva/sclera: Conjunctivae normal.     Pupils: Pupils are equal, round, and reactive to light.    Cardiovascular:     Rate and Rhythm: Normal rate and regular rhythm.     Heart sounds: No murmur heard. Pulmonary:     Effort: Pulmonary effort is normal. No respiratory distress.     Breath sounds: Normal breath sounds. No wheezing, rhonchi or rales.  Abdominal:     General: Abdomen is flat. Bowel sounds are normal.   Musculoskeletal:     Cervical back: Normal range of motion and neck supple.   Skin:    General: Skin is warm and dry.     Capillary Refill: Capillary refill takes less than 2 seconds.   Neurological:     General: No focal deficit present.     Mental Status: He is alert and oriented to person, place, and time.   Psychiatric:  Mood and Affect: Mood normal.        Behavior: Behavior normal.        Thought Content: Thought content normal.        Judgment: Judgment normal.     Results for orders placed or performed in visit on 10/10/23  Urinalysis, Routine w reflex microscopic   Collection Time: 10/10/23  9:12 AM  Result Value Ref Range   Specific Gravity, UA 1.020 1.005 - 1.030   pH, UA 7.0 5.0 - 7.5   Color, UA Yellow Yellow   Appearance Ur Clear Clear   Leukocytes,UA Negative Negative   Protein,UA Negative Negative/Trace   Glucose, UA Negative Negative   Ketones, UA Negative Negative   RBC, UA Negative Negative   Bilirubin, UA Negative Negative   Urobilinogen, Ur 0.2 0.2 - 1.0 mg/dL   Nitrite, UA Negative Negative   Microscopic  Examination Comment   TSH   Collection Time: 10/10/23  9:24 AM  Result Value Ref Range   TSH 3.780 0.450 - 4.500 uIU/mL  PSA   Collection Time: 10/10/23  9:24 AM  Result Value Ref Range   Prostate Specific Ag, Serum 0.7 0.0 - 4.0 ng/mL  Lipid panel   Collection Time: 10/10/23  9:24 AM  Result Value Ref Range   Cholesterol, Total 138 100 - 199 mg/dL   Triglycerides 885 0 - 149 mg/dL   HDL 49 >60 mg/dL   VLDL Cholesterol Cal 21 5 - 40 mg/dL   LDL Chol Calc (NIH) 68 0 - 99 mg/dL   Chol/HDL Ratio 2.8 0.0 - 5.0 ratio  CBC with Differential/Platelet   Collection Time: 10/10/23  9:24 AM  Result Value Ref Range   WBC 5.6 3.4 - 10.8 x10E3/uL   RBC 4.66 4.14 - 5.80 x10E6/uL   Hemoglobin 14.3 13.0 - 17.7 g/dL   Hematocrit 55.4 62.4 - 51.0 %   MCV 96 79 - 97 fL   MCH 30.7 26.6 - 33.0 pg   MCHC 32.1 31.5 - 35.7 g/dL   RDW 87.6 88.3 - 84.5 %   Platelets 212 150 - 450 x10E3/uL   Neutrophils 58 Not Estab. %   Lymphs 28 Not Estab. %   Monocytes 10 Not Estab. %   Eos 3 Not Estab. %   Basos 1 Not Estab. %   Neutrophils Absolute 3.2 1.4 - 7.0 x10E3/uL   Lymphocytes Absolute 1.6 0.7 - 3.1 x10E3/uL   Monocytes Absolute 0.6 0.1 - 0.9 x10E3/uL   EOS (ABSOLUTE) 0.2 0.0 - 0.4 x10E3/uL   Basophils Absolute 0.0 0.0 - 0.2 x10E3/uL   Immature Granulocytes 0 Not Estab. %   Immature Grans (Abs) 0.0 0.0 - 0.1 x10E3/uL  Comprehensive metabolic panel   Collection Time: 10/10/23  9:24 AM  Result Value Ref Range   Glucose 95 70 - 99 mg/dL   BUN 13 8 - 27 mg/dL   Creatinine, Ser 8.88 0.76 - 1.27 mg/dL   eGFR 76 >40 fO/fpw/8.26   BUN/Creatinine Ratio 12 10 - 24   Sodium 142 134 - 144 mmol/L   Potassium 4.4 3.5 - 5.2 mmol/L   Chloride 105 96 - 106 mmol/L   CO2 22 20 - 29 mmol/L   Calcium  9.1 8.6 - 10.2 mg/dL   Total Protein 6.2 6.0 - 8.5 g/dL   Albumin 4.5 3.8 - 4.9 g/dL   Globulin, Total 1.7 1.5 - 4.5 g/dL   Bilirubin Total 0.4 0.0 - 1.2 mg/dL   Alkaline Phosphatase 77 44 - 121 IU/L  AST 24 0 -  40 IU/L   ALT 34 0 - 44 IU/L  Magnesium   Collection Time: 10/10/23  9:24 AM  Result Value Ref Range   Magnesium 2.1 1.6 - 2.3 mg/dL      Assessment & Plan:   Problem List Items Addressed This Visit       Cardiovascular and Mediastinum   Essential hypertension, benign   Chronic.  Controlled.  Continue with current medication regimen of Lisinopril  5mg .  Refills sent today.  Labs ordered today.  Return to clinic in 6 months for reevaluation.  Call sooner if concerns arise.       Relevant Medications   atorvastatin  (LIPITOR) 40 MG tablet   lisinopril  (ZESTRIL ) 5 MG tablet   Other Relevant Orders   Comprehensive metabolic panel with GFR   Ascending aorta dilation (HCC) - Primary   Chronic.  Followed by Cardiology.  Continue with Atorvastatin .  Keep blood pressures well controlled.        Relevant Medications   atorvastatin  (LIPITOR) 40 MG tablet   lisinopril  (ZESTRIL ) 5 MG tablet     Other   Hyperlipidemia   Chronic.  Controlled.  Continue with current medication regimen of atorvastatin .  Refills sent today.  Labs ordered today.  Return to clinic in 6 months for reevaluation.  Call sooner if concerns arise.        Relevant Medications   atorvastatin  (LIPITOR) 40 MG tablet   lisinopril  (ZESTRIL ) 5 MG tablet   Other Relevant Orders   Lipid panel   Other Visit Diagnoses       Need for Td vaccine       Relevant Orders   Td : Tetanus/diphtheria >7yo Preservative  free     Screening for colon cancer       Relevant Orders   Ambulatory referral to Gastroenterology        Follow up plan: Return in about 6 months (around 10/09/2024) for Physical and Fasting labs.

## 2024-04-09 NOTE — Assessment & Plan Note (Signed)
Chronic.  Controlled.  Continue with current medication regimen of Lisinopril 5mg .  Refills sent today.  Labs ordered today.  Return to clinic in 6 months for reevaluation.  Call sooner if concerns arise.

## 2024-04-09 NOTE — Assessment & Plan Note (Signed)
 Chronic.  Followed by Cardiology.  Continue with Atorvastatin .  Keep blood pressures well controlled.

## 2024-04-10 ENCOUNTER — Ambulatory Visit: Payer: Self-pay | Admitting: Nurse Practitioner

## 2024-04-10 LAB — COMPREHENSIVE METABOLIC PANEL WITH GFR
ALT: 21 IU/L (ref 0–44)
AST: 21 IU/L (ref 0–40)
Albumin: 4.8 g/dL (ref 3.9–4.9)
Alkaline Phosphatase: 74 IU/L (ref 44–121)
BUN/Creatinine Ratio: 14 (ref 10–24)
BUN: 16 mg/dL (ref 8–27)
Bilirubin Total: 0.6 mg/dL (ref 0.0–1.2)
CO2: 19 mmol/L — ABNORMAL LOW (ref 20–29)
Calcium: 9.6 mg/dL (ref 8.6–10.2)
Chloride: 105 mmol/L (ref 96–106)
Creatinine, Ser: 1.15 mg/dL (ref 0.76–1.27)
Globulin, Total: 2 g/dL (ref 1.5–4.5)
Glucose: 95 mg/dL (ref 70–99)
Potassium: 4.4 mmol/L (ref 3.5–5.2)
Sodium: 142 mmol/L (ref 134–144)
Total Protein: 6.8 g/dL (ref 6.0–8.5)
eGFR: 72 mL/min/{1.73_m2} (ref >59)

## 2024-04-10 LAB — LIPID PANEL
Chol/HDL Ratio: 2.8 ratio (ref 0.0–5.0)
Cholesterol, Total: 122 mg/dL (ref 100–199)
HDL: 44 mg/dL (ref >39)
LDL Chol Calc (NIH): 55 mg/dL (ref 0–99)
Triglycerides: 134 mg/dL (ref 0–149)
VLDL Cholesterol Cal: 23 mg/dL (ref 5–40)

## 2024-04-27 ENCOUNTER — Ambulatory Visit: Payer: Self-pay | Attending: Cardiology | Admitting: Cardiology

## 2024-04-27 ENCOUNTER — Encounter: Payer: Self-pay | Admitting: Cardiology

## 2024-04-27 VITALS — BP 128/84 | HR 69 | Ht 74.0 in | Wt 271.8 lb

## 2024-04-27 DIAGNOSIS — I10 Essential (primary) hypertension: Secondary | ICD-10-CM

## 2024-04-27 DIAGNOSIS — E7800 Pure hypercholesterolemia, unspecified: Secondary | ICD-10-CM | POA: Diagnosis not present

## 2024-04-27 DIAGNOSIS — I77810 Thoracic aortic ectasia: Secondary | ICD-10-CM

## 2024-04-27 DIAGNOSIS — I493 Ventricular premature depolarization: Secondary | ICD-10-CM | POA: Diagnosis not present

## 2024-04-27 NOTE — Patient Instructions (Signed)
 Medication Instructions:  Your physician recommends that you continue on your current medications as directed. Please refer to the Current Medication list given to you today.   *If you need a refill on your cardiac medications before your next appointment, please call your pharmacy*  Lab Work: No labs ordered today  If you have labs (blood work) drawn today and your tests are completely normal, you will receive your results only by: MyChart Message (if you have MyChart) OR A paper copy in the mail If you have any lab test that is abnormal or we need to change your treatment, we will call you to review the results.  Testing/Procedures: Your physician has requested that you have an echocardiogram. Echocardiography is a painless test that uses sound waves to create images of your heart. It provides your doctor with information about the size and shape of your heart and how well your heart's chambers and valves are working.   You may receive an ultrasound enhancing agent through an IV if needed to better visualize your heart during the echo. This procedure takes approximately one hour.  There are no restrictions for this procedure.  This will take place at [address] [facility] [address]) [address], [place] [address]  Please note: We ask at that you not bring children with you during ultrasound (echo/ vascular) testing. Due to room size and safety concerns, children are not allowed in the ultrasound rooms during exams. Our front office staff cannot provide observation of children in our lobby area while testing is being conducted. An adult accompanying a patient to their appointment will only be allowed in the ultrasound room at the discretion of the ultrasound technician under special circumstances. We apologize for any inconvenience.   Follow-Up: At [facility], you and your health needs are our priority.  As part of our continuing mission to provide you with exceptional heart  care, our providers are all part of one team.  This team includes your primary Cardiologist (physician) and Advanced Practice Providers or APPs (Physician Assistants and Nurse Practitioners) who all work together to provide you with the care you need, when you need it.  Your next appointment:   1 year(s)  Provider:   You may see Redell Cave, MD or one of the following Advanced Practice Providers on your designated Care Team:   Lonni Meager, NP Lesley Maffucci, PA-C Bernardino Bring, PA-C Cadence [place], PA-C Tylene Lunch, NP Barnie Hila, NP    We recommend signing up for the patient portal called MyChart.  Sign up information is provided on this After Visit Summary.  MyChart is used to connect with patients for Virtual Visits (Telemedicine).  Patients are able to view lab/test results, encounter notes, upcoming appointments, etc.  Non-urgent messages can be sent to your provider as well.   To learn more about what you can do with MyChart, go to [email or web address].

## 2024-04-27 NOTE — Progress Notes (Signed)
 Cardiology Office Note:    Date:  04/27/2024   ID:  Micheal Khan, DOB December 01, 1960, MRN [id number]  PCP:  Melvin Pao, NP  [facility] Cardiologist:  Redell Cave, MD  [facility] Electrophysiologist:  None   Referring MD: Melvin Pao, NP   Chief Complaint  Patient presents with   Follow-up    17 month follow up visit. Patient is doing well on today. Meds reviewed.      History of Present Illness:    Micheal Khan is a 63 y.o. male with a hx of hypertension, ascending aortic dilatation, PVCs who presents for follow-up.   Previously seen for skipped heartbeats.  He has a history of PVCs.  Cardiac monitor was placed to evaluate any significant arrhythmias and document PVC burden.  Over the past month or so, symptoms have improved.  Blood pressure is usually well-controlled at home.  Has no new concerns at this time.  Prior notes Cardiac monitor 09/2019 20 paroxysmal SVT, occasional PVCs 2.6% burden. Echo 10/2020 EF 55 to 60%.  Moderate ascending aorta dilatation 45 mm. Coronary CTA 09/2020 calcium  score 0, no evidence of CAD Father passed from MI in his 42s   Past Medical History:  Diagnosis Date   Cancer (HCC) 07/2004   testicular-surgery and chemo   Complication of anesthesia    nausea with colonoscopy in [place] 9 years ago   Family history of breast cancer    Family history of colon cancer    Family history of melanoma    GERD (gastroesophageal reflux disease)    Hypertension    PONV (postoperative nausea and vomiting)     Past Surgical History:  Procedure Laterality Date   COLONOSCOPY WITH PROPOFOL      COLONOSCOPY WITH PROPOFOL  N/A 01/08/2020   Procedure: COLONOSCOPY WITH PROPOFOL ;  Surgeon: Therisa Bi, MD;  Location[facility] ENDOSCOPY;  Service: Gastroenterology;  Laterality: N/A;   COLONOSCOPY WITH PROPOFOL  N/A 01/16/2021   Procedure: COLONOSCOPY WITH PROPOFOL ;  Surgeon: Therisa Bi, MD;  Location: [facility] ENDOSCOPY;  Service:  Gastroenterology;  Laterality: N/A;   ESOPHAGOGASTRODUODENOSCOPY (EGD) WITH PROPOFOL      TESTICLE REMOVAL Left 07/2004    Current Medications: Current Meds  Medication Sig   atorvastatin  (LIPITOR) 40 MG tablet Take 1 tablet (40 mg total) by mouth daily.   cholecalciferol (VITAMIN D ) 1000 units tablet Take 4,000 Units by mouth daily.   famotidine  (PEPCID ) 10 MG tablet Take 10 mg by mouth daily.   lisinopril  (ZESTRIL ) 5 MG tablet Take 1 tablet (5 mg total) by mouth daily.   Magnesium 250 MG CAPS Take 250 mg by mouth daily. Take one tablet by mouth daily   Multiple Vitamin (ONE-A-DAY MENS PO) Take 1 tablet by mouth daily.   Probiotic Product (PROBIOTIC DAILY PO) Take by mouth daily.     Allergies:   Patient has no known allergies.   Social History   Socioeconomic History   Marital status: Married    Spouse name: Not on file   Number of children: Not on file   Years of education: Not on file   Highest education level: Bachelor's degree (e.g., BA, AB, BS)  Occupational History   Not on file  Tobacco Use   Smoking status: Never   Smokeless tobacco: Never  Vaping Use   Vaping status: Never Used  Substance and Sexual Activity   Alcohol use: No    Alcohol/week: 0.0 standard drinks of alcohol   Drug use: No   Sexual activity: Yes  Other Topics Concern   Not on file  Social History Narrative   Not on file   Social Drivers of Health   Financial Resource Strain: Low Risk  (04/08/2024)   Overall Financial Resource Strain (CARDIA)    Difficulty of Paying Living Expenses: Not very hard  Food Insecurity: No Food Insecurity (04/08/2024)   Hunger Vital Sign    Worried About Running Out of Food in the Last Year: Never true    Ran Out of Food in the Last Year: Never true  Transportation Needs: No Transportation Needs (04/08/2024)   PRAPARE - Administrator, Civil Service (Medical): No    Lack of Transportation (Non-Medical): No  Physical Activity: Sufficiently Active  (04/08/2024)   Exercise Vital Sign    Days of Exercise per Week: 6 days    Minutes of Exercise per Session: 60 min  Stress: No Stress Concern Present (04/08/2024)   [employer] of Occupational Health - Occupational Stress Questionnaire    Feeling of Stress: Not at all  Social Connections: Socially Integrated (04/08/2024)   Social Connection and Isolation Panel    Frequency of Communication with Friends and Family: Three times a week    Frequency of Social Gatherings with Friends and Family: Once a week    Attends Religious Services: More than 4 times per year    Active Member of [employer] or Organizations: Yes    Attends Engineer, structural: More than 4 times per year    Marital Status: Married     Family History: The patient's family history includes Breast cancer (age of onset: 73) in his maternal aunt; Cancer in his maternal grandfather; Colon cancer (age of onset: 38) in his brother; Colon polyps in his paternal uncle; Heart disease in his father and paternal grandfather; Melanoma (age of onset: 39) in his mother; Stroke in his paternal grandmother.  ROS:   Please see the history of present illness.     All other systems reviewed and are negative.  EKGs/Labs/Other Studies Reviewed:    The following studies were reviewed today:   EKG:  EKG is  ordered today.  The ekg ordered today demonstrates sinus rhythm.  Recent Labs: 10/10/2023: Hemoglobin 14.3; Magnesium 2.1; Platelets 212; TSH 3.780 04/09/2024: ALT 21; BUN 16; Creatinine, Ser 1.15; Potassium 4.4; Sodium 142  Recent Lipid Panel    Component Value Date/Time   CHOL 122 04/09/2024 0913   TRIG 134 04/09/2024 0913   HDL 44 04/09/2024 0913   CHOLHDL 2.8 04/09/2024 0913   LDLCALC 55 04/09/2024 0913     Risk Assessment/Calculations:      Physical Exam:    VS:  BP 128/84   Pulse 69   Ht 6' 2 (1.88 m)   Wt 271 lb 12.8 oz (123.3 kg)   SpO2 94%   BMI 34.90 kg/m     Wt Readings from Last 3 Encounters:   04/27/24 271 lb 12.8 oz (123.3 kg)  04/09/24 270 lb (122.5 kg)  10/10/23 269 lb (122 kg)     GEN:  Well nourished, well developed in no acute distress HEENT: Normal NECK: No JVD; No carotid bruits CARDIAC: RRR, no murmurs, rubs, gallops RESPIRATORY:  Clear to auscultation without rales, wheezing or rhonchi  ABDOMEN: Soft, non-tender, non-distended MUSCULOSKELETAL:  No edema; No deformity  SKIN: Warm and dry NEUROLOGIC:  Alert and oriented x 3 PSYCHIATRIC:  Normal affect   ASSESSMENT:    1. Primary hypertension   2. Ascending aorta dilatation (HCC)  3. Pure hypercholesterolemia   4. PVC (premature ventricular contraction)    PLAN:    In order of problems listed above:  hypertension, BP controlled.  Continue lisinopril  5 mg daily Mild Ascending aorta dilatation, repeat echo 10/2021 shows mild aortic root and ascending aorta dilatation.  Aortic root 42 mm, ascending aorta 4 mm.  Repeat echo to monitor aortic root and ascending aorta.. Hyperlipidemia, cholesterol controlled, continue Lipitor 40 mg daily. History of PVCs, 2.6% burden, denies palpitations.  Monitor off AV nodal agents.  Last EF normal.  Follow-up in 1 year.   Shared Decision Making/Informed Consent       Medication Adjustments/Labs and Tests Ordered: Current medicines are reviewed at length with the patient today.  Concerns regarding medicines are outlined above.  Orders Placed This Encounter  Procedures   EKG 12-Lead   ECHOCARDIOGRAM COMPLETE   No orders of the defined types were placed in this encounter.   Patient Instructions  Medication Instructions:  Your physician recommends that you continue on your current medications as directed. Please refer to the Current Medication list given to you today.   *If you need a refill on your cardiac medications before your next appointment, please call your pharmacy*  Lab Work: No labs ordered today  If you have labs (blood work) drawn today and your tests  are completely normal, you will receive your results only by: MyChart Message (if you have MyChart) OR A paper copy in the mail If you have any lab test that is abnormal or we need to change your treatment, we will call you to review the results.  Testing/Procedures: Your physician has requested that you have an echocardiogram. Echocardiography is a painless test that uses sound waves to create images of your heart. It provides your doctor with information about the size and shape of your heart and how well your heart's chambers and valves are working.   You may receive an ultrasound enhancing agent through an IV if needed to better visualize your heart during the echo. This procedure takes approximately one hour.  There are no restrictions for this procedure.  This will take place at [address] [facility] [address]) [address], [place] [address]  Please note: We ask at that you not bring children with you during ultrasound (echo/ vascular) testing. Due to room size and safety concerns, children are not allowed in the ultrasound rooms during exams. Our front office staff cannot provide observation of children in our lobby area while testing is being conducted. An adult accompanying a patient to their appointment will only be allowed in the ultrasound room at the discretion of the ultrasound technician under special circumstances. We apologize for any inconvenience.   Follow-Up: At [facility], you and your health needs are our priority.  As part of our continuing mission to provide you with exceptional heart care, our providers are all part of one team.  This team includes your primary Cardiologist (physician) and Advanced Practice Providers or APPs (Physician Assistants and Nurse Practitioners) who all work together to provide you with the care you need, when you need it.  Your next appointment:   1 year(s)  Provider:   You may see Redell Cave, MD or one of the following  Advanced Practice Providers on your designated Care Team:   Lonni Meager, NP Lesley Maffucci, PA-C Bernardino Bring, PA-C Cadence [place], PA-C Tylene Lunch, NP Barnie Hila, NP    We recommend signing up for the patient portal called MyChart.  Sign up information is provided on this After Visit Summary.  MyChart is used to connect with patients for Virtual Visits (Telemedicine).  Patients are able to view lab/test results, encounter notes, upcoming appointments, etc.  Non-urgent messages can be sent to your provider as well.   To learn more about what you can do with MyChart, go to [email or web address].          Signed, Redell Cave, MD  04/27/2024 10:38 AM    [facility]

## 2024-04-30 ENCOUNTER — Ambulatory Visit: Payer: Self-pay

## 2024-04-30 DIAGNOSIS — N529 Male erectile dysfunction, unspecified: Secondary | ICD-10-CM

## 2024-04-30 NOTE — Telephone Encounter (Signed)
 FYI Only or Action Required?: Action required by provider: referral request. Requesting referral to [facility] [place] or [facility] [place] in [place]  Patient was last seen in primary care on 04/09/2024 by Melvin Pao, NP.  Called Nurse Triage reporting Groin Swelling/ Tingling in testicles, erectile dysfunction  Symptoms began several weeks ago.  Interventions attempted: Nothing.  Symptoms are: unchanged.  Triage Disposition: See PCP When Office is Open (Within 3 Days)  Patient/caregiver understands and will follow disposition?: Yes   Copied from CRM [id number]. Topic: Clinical - Red Word Triage >> Apr 30, 2024 11:20 AM Turkey B wrote: Pt says penis is sensitive and has tingling going on there Reason for Disposition  All other penis - scrotum symptoms  (Exception: Painless rash < 24 hours duration.)  Answer Assessment - Initial Assessment Questions 1. SYMPTOM: What's the main symptom you're concerned about? (e.g., blood in semen, discharge or pus from penis, itching, pain, rash, swelling)     Unable to have an erection and tingling in the testicle  2. LOCATION: Where is the tingling located?     All over 1 testicle  3. ONSET: When did symptoms  start?     10 days ago  4. PAIN: Is there any pain? If Yes, ask: How bad is it?  (Scale 1-10; or mild, moderate, severe)     No  5. URINE: Any difficulty passing urine? If Yes, ask: When was the last time?     No, still urinating okay  6. CAUSE: What do you think is causing the symptoms?     Unsure of cause, but has history of testicular cancer  7. OTHER SYMPTOMS: Do you have any other symptoms? (e.g., blood in urine, abdomen pain, fever)     Pain in lower abd intermittently  Protocols used: Penis and Scrotum Symptoms-A-AH

## 2024-05-01 NOTE — Addendum Note (Signed)
 Addended by: MELVIN PAO on: 05/01/2024 08:25 AM   Modules accepted: Orders

## 2024-05-01 NOTE — Telephone Encounter (Signed)
 Patient notified

## 2024-05-01 NOTE — Telephone Encounter (Signed)
 I placed the referral.

## 2024-05-02 ENCOUNTER — Ambulatory Visit: Admitting: Nurse Practitioner

## 2024-05-02 ENCOUNTER — Encounter: Payer: Self-pay | Admitting: Nurse Practitioner

## 2024-05-02 VITALS — BP 124/61 | HR 65 | Temp 98.1°F | Wt 269.2 lb

## 2024-05-02 DIAGNOSIS — Z1211 Encounter for screening for malignant neoplasm of colon: Secondary | ICD-10-CM

## 2024-05-02 DIAGNOSIS — N489 Disorder of penis, unspecified: Secondary | ICD-10-CM | POA: Diagnosis not present

## 2024-05-02 LAB — URINALYSIS, ROUTINE W REFLEX MICROSCOPIC
Bilirubin, UA: NEGATIVE
Glucose, UA: NEGATIVE
Ketones, UA: NEGATIVE
Leukocytes,UA: NEGATIVE
Nitrite, UA: NEGATIVE
Protein,UA: NEGATIVE
RBC, UA: NEGATIVE
Specific Gravity, UA: 1.025 (ref 1.005–1.030)
Urobilinogen, Ur: 0.2 mg/dL (ref 0.2–1.0)
pH, UA: 6 (ref 5.0–7.5)

## 2024-05-02 NOTE — Progress Notes (Signed)
 BP 124/61   Pulse 65   Temp 98.1 F (36.7 C) (Oral)   Wt 269 lb 3.2 oz (122.1 kg)   SpO2 98%   BMI 34.56 kg/m    Subjective:    Patient ID: Micheal Khan, male    DOB: [birth year], 63 y.o.   MRN: [id number]  HPI: Micheal Khan is a 63 y.o. male  Chief Complaint  Patient presents with   Groin Pain    Patient states he has been have issues with his penis feeling lifeless in the last 2 weeks. States he has not been able to get an erection. States he was also having tingling in his testicle, states he only has one due to testicular cancer in the past. States he was seen at [facility] in [facility] a couple of days ago, had a UA and CT done. Normal results per patient.    Patient presents to clinic with complaints of his penis has felt lifeless for about the last 10 days.  He sometimes wakes up in the middle of the night with one but hasn't had one recently.  He does feel like he has some tingling in the testicle- only has one due to testicular cancer in the past.  He was seen in the ED on 04/30/24 and UA was unremarkable other than Ketones.  He also had an ultrasound which was unremarkable.     Relevant past medical, surgical, family and social history reviewed and updated as indicated. Interim medical history since our last visit reviewed. Allergies and medications reviewed and updated.  Review of Systems  Genitourinary:        Lifeless penis    Per HPI unless specifically indicated above     Objective:    BP 124/61   Pulse 65   Temp 98.1 F (36.7 C) (Oral)   Wt 269 lb 3.2 oz (122.1 kg)   SpO2 98%   BMI 34.56 kg/m   Wt Readings from Last 3 Encounters:  05/02/24 269 lb 3.2 oz (122.1 kg)  04/27/24 271 lb 12.8 oz (123.3 kg)  04/09/24 270 lb (122.5 kg)    Physical Exam Vitals and nursing note reviewed.  Constitutional:      General: He is not in acute distress.    Appearance: Normal appearance. He is not ill-appearing, toxic-appearing or diaphoretic.  HENT:     Head:  Normocephalic.     Right Ear: External ear normal.     Left Ear: External ear normal.     Nose: Nose normal. No congestion or rhinorrhea.     Mouth/Throat:     Mouth: Mucous membranes are moist.  Eyes:     General:        Right eye: No discharge.        Left eye: No discharge.     Extraocular Movements: Extraocular movements intact.     Conjunctiva/sclera: Conjunctivae normal.     Pupils: Pupils are equal, round, and reactive to light.  Cardiovascular:     Rate and Rhythm: Normal rate and regular rhythm.     Heart sounds: No murmur heard. Pulmonary:     Effort: Pulmonary effort is normal. No respiratory distress.     Breath sounds: Normal breath sounds. No wheezing, rhonchi or rales.  Abdominal:     General: Abdomen is flat. Bowel sounds are normal.  Musculoskeletal:     Cervical back: Normal range of motion and neck supple.  Skin:    General: Skin is warm and  dry.     Capillary Refill: Capillary refill takes less than 2 seconds.  Neurological:     General: No focal deficit present.     Mental Status: He is alert and oriented to person, place, and time.  Psychiatric:        Mood and Affect: Mood normal.        Behavior: Behavior normal.        Thought Content: Thought content normal.        Judgment: Judgment normal.     Results for orders placed or performed in visit on 04/09/24  Comprehensive metabolic panel with GFR   Collection Time: 04/09/24  9:13 AM  Result Value Ref Range   Glucose 95 70 - 99 mg/dL   BUN 16 8 - 27 mg/dL   Creatinine, Ser 8.84 0.76 - 1.27 mg/dL   eGFR 72 >40 fO/fpw/8.26   BUN/Creatinine Ratio 14 10 - 24   Sodium 142 134 - 144 mmol/L   Potassium 4.4 3.5 - 5.2 mmol/L   Chloride 105 96 - 106 mmol/L   CO2 19 (L) 20 - 29 mmol/L   Calcium  9.6 8.6 - 10.2 mg/dL   Total Protein 6.8 6.0 - 8.5 g/dL   Albumin 4.8 3.9 - 4.9 g/dL   Globulin, Total 2.0 1.5 - 4.5 g/dL   Bilirubin Total 0.6 0.0 - 1.2 mg/dL   Alkaline Phosphatase 74 44 - 121 IU/L   AST 21  0 - 40 IU/L   ALT 21 0 - 44 IU/L  Lipid panel   Collection Time: 04/09/24  9:13 AM  Result Value Ref Range   Cholesterol, Total 122 100 - 199 mg/dL   Triglycerides 865 0 - 149 mg/dL   HDL 44 >60 mg/dL   VLDL Cholesterol Cal 23 5 - 40 mg/dL   LDL Chol Calc (NIH) 55 0 - 99 mg/dL   Chol/HDL Ratio 2.8 0.0 - 5.0 ratio      Assessment & Plan:   Problem List Items Addressed This Visit   None Visit Diagnoses       Penile abnormality    -  Primary   UA and PSA done in office today.  US  two days ago was unremarkable. Has an appt in October with Urology. Prefers to wait to start medication for ED until then.   Relevant Orders   PSA   Urinalysis, Routine w reflex microscopic     Screening for colon cancer       Relevant Orders   Ambulatory referral to Gastroenterology        Follow up plan: Return if symptoms worsen or fail to improve.

## 2024-05-03 ENCOUNTER — Ambulatory Visit: Payer: Self-pay | Admitting: Nurse Practitioner

## 2024-05-03 LAB — PSA: Prostate Specific Ag, Serum: 0.6 ng/mL (ref 0.0–4.0)

## 2024-06-06 ENCOUNTER — Telehealth: Payer: Self-pay | Admitting: Cardiology

## 2024-06-06 NOTE — Telephone Encounter (Signed)
 Called pt to know that his insurance is not in network for  09/03 for the echo but [facility] [place]. Let us  know . Micheal Khan wants to know too.  This patient is scheduled for an echocardiogram on 06/13/2024. He has [facility] and the plan will not authorize the echocardiogram request due to [place]'s facilities being out of network. If the patient decides to move forward with the exam, they will be considered self-pay (will not qualify for the self-pay discount) and the insurance will not be filed. The patient will be responsible for the cost of the exam.    The following facilities are in network for the patient's policy, please advise if the patient would like to switch to any of these locations and the authorization will be submitted.     [facility] at [facility]: [phone]  [address]  Second Floor  [place], [place] [address]   Or   [facility])  [facility]: [phone]  [address]  First Floor  [place], [place] [address]

## 2024-06-08 ENCOUNTER — Telehealth: Payer: Self-pay | Admitting: Cardiology

## 2024-06-08 NOTE — Telephone Encounter (Signed)
-----   Message from [place] A sent at 06/06/2024 12:34 PM EDT ----- Regarding: RE: ECHO - BCBS OON LVM BCBS ins OON ----- Message ----- From: Melanee Delon SAUNDERS Sent: 06/06/2024   8:47 AM EDT To: [place] LELON Cynda Tinnie KATHEE Schools; Rosina G# Subject: ECHO - BCBS OON                                Hey,  This patient is scheduled for an echocardiogram on 06/13/2024. He has [facility] and the plan will not authorize the echocardiogram request due to [place]'s facilities being out of network. If the patient decides to move forward with the exam, they will be considered self-pay (will not qualify for the self-pay discount) and the insurance will not be filed. The patient will be responsible for the cost of the exam.    The following facilities are in network for the patient's policy, please advise if the patient would like to switch to any of these locations and the authorization will be submitted.     [facility] Cardiology at [facility]: [phone]  [address]  Second Floor  [place], [place] [address]   Or   [facility])  [facility]: [phone]  [address]  First Floor  [place], [place] [address]    Thank you,   Jenn G.

## 2024-06-08 NOTE — Telephone Encounter (Signed)
 Left message on VM to call and discuss

## 2024-06-13 ENCOUNTER — Ambulatory Visit

## 2024-06-14 ENCOUNTER — Encounter (HOSPITAL_COMMUNITY): Payer: Self-pay

## 2024-06-14 DIAGNOSIS — I77810 Thoracic aortic ectasia: Secondary | ICD-10-CM

## 2024-07-02 ENCOUNTER — Other Ambulatory Visit

## 2024-07-02 ENCOUNTER — Telehealth: Payer: Self-pay | Admitting: Cardiology

## 2024-07-02 NOTE — Telephone Encounter (Signed)
 Echo order efaxed to fax number provided for [facility]

## 2024-07-02 NOTE — Telephone Encounter (Signed)
 [facility] calling received order for Echo they are requesting we send over the demographics to [phone]

## 2024-07-02 NOTE — Telephone Encounter (Signed)
 Pt called in asking for echo order to be faxed over, he states they never received.   Fax: [phone]

## 2024-10-12 ENCOUNTER — Encounter: Admitting: Nurse Practitioner

## 2024-11-06 ENCOUNTER — Other Ambulatory Visit: Payer: Self-pay | Admitting: Nurse Practitioner

## 2024-11-06 DIAGNOSIS — E782 Mixed hyperlipidemia: Secondary | ICD-10-CM

## 2024-11-06 NOTE — Telephone Encounter (Unsigned)
 Copied from CRM #[id number]. Topic: Clinical - Medication Refill >> Nov 06, 2024 10:45 AM Rosaria BRAVO wrote: Medication: atorvastatin  (LIPITOR) 40 MG tablet  Has the patient contacted their pharmacy? Yes (Agent: If no, request that the patient contact the pharmacy for the refill. If patient does not wish to contact the pharmacy document the reason why and proceed with request.) (Agent: If yes, when and what did the pharmacy advise?)  This is the patient's preferred pharmacy:  [facility] - [place], [place] - [address] [place] [address] Phone: [phone] Fax: [phone]  [id number] - [place], [place] - [address] [place] [address] Phone: [phone] Fax: [phone]  Is this the correct pharmacy for this prescription? Yes If no, delete pharmacy and type the correct one.   Has the prescription been filled recently? Yes  Is the patient out of the medication? No  Has the patient been seen for an appointment in the last year OR does the patient have an upcoming appointment? Yes  Can we respond through MyChart? Yes  Agent: Please be advised that Rx refills may take up to 3 business days. We ask that you follow-up with your pharmacy.

## 2024-11-07 ENCOUNTER — Encounter: Admitting: Nurse Practitioner

## 2024-11-07 MED ORDER — ATORVASTATIN CALCIUM 40 MG PO TABS: 40.0000 mg | ORAL_TABLET | Freq: Every day | ORAL | 0 refills | Status: AC

## 2024-11-07 NOTE — Telephone Encounter (Signed)
 Requested medication (s) are due for refill today: expired medication   Requested medication (s) are on the active medication list: yes   Last refill:  04/09/24- 10/06/24 #90 1 refills  Future visit scheduled: yes 11/26/24  Notes to clinic:  expired medication . Do you want to renew Rx?     Requested Prescriptions  Pending Prescriptions Disp Refills   atorvastatin  (LIPITOR) 40 MG tablet 90 tablet 1    Sig: Take 1 tablet (40 mg total) by mouth daily.     Cardiovascular:  Antilipid - Statins Failed - 11/07/2024 10:15 AM      Failed - Lipid Panel in normal range within the last 12 months    Cholesterol, Total  Date Value Ref Range Status  04/09/2024 122 100 - 199 mg/dL Final   LDL Chol Calc (NIH)  Date Value Ref Range Status  04/09/2024 55 0 - 99 mg/dL Final   HDL  Date Value Ref Range Status  04/09/2024 44 >39 mg/dL Final   Triglycerides  Date Value Ref Range Status  04/09/2024 134 0 - 149 mg/dL Final         Passed - Patient is not pregnant      Passed - Valid encounter within last 12 months    Recent Outpatient Visits           6 months ago Penile abnormality   [place] [facility] Melvin Pao, NP   7 months ago Ascending aorta dilation   [place] [facility] Melvin Pao, NP

## 2024-11-26 ENCOUNTER — Encounter: Admitting: Nurse Practitioner
